# Patient Record
Sex: Female | Born: 1965 | Race: Black or African American | Hispanic: No | Smoking: Never smoker
Health system: Southern US, Community
[De-identification: ages and names within clinical notes are randomized; demographics above are authoritative.]

## PROBLEM LIST (undated history)

## (undated) ENCOUNTER — Ambulatory Visit (HOSPITAL_COMMUNITY): Discharge status: Absent without leave | Payer: 59

## (undated) DIAGNOSIS — Z87898 Personal history of other specified conditions: Secondary | ICD-10-CM

## (undated) DIAGNOSIS — K219 Gastro-esophageal reflux disease without esophagitis: Secondary | ICD-10-CM

## (undated) DIAGNOSIS — R7303 Prediabetes: Secondary | ICD-10-CM

## (undated) DIAGNOSIS — I517 Cardiomegaly: Secondary | ICD-10-CM

## (undated) DIAGNOSIS — G43909 Migraine, unspecified, not intractable, without status migrainosus: Secondary | ICD-10-CM

## (undated) DIAGNOSIS — J45909 Unspecified asthma, uncomplicated: Secondary | ICD-10-CM

## (undated) DIAGNOSIS — D649 Anemia, unspecified: Secondary | ICD-10-CM

## (undated) HISTORY — DX: Personal history of other specified conditions: Z87.898

## (undated) HISTORY — DX: Migraine, unspecified, not intractable, without status migrainosus: G43.909

## (undated) HISTORY — PX: ECTOPIC PREGNANCY SURGERY: SHX613

## (undated) HISTORY — PX: DILATION AND CURETTAGE OF UTERUS: SHX78

## (undated) HISTORY — DX: Gastro-esophageal reflux disease without esophagitis: K21.9

## (undated) HISTORY — DX: Anemia, unspecified: D64.9

## (undated) HISTORY — PX: OTHER SURGICAL HISTORY: SHX169

## (undated) HISTORY — PX: WISDOM TOOTH EXTRACTION: SHX21

---

## 2017-03-22 ENCOUNTER — Ambulatory Visit (HOSPITAL_COMMUNITY)
Admission: EM | Admit: 2017-03-22 | Discharge: 2017-03-22 | Disposition: A | Discharge status: Home / Self Care | Payer: Self-pay | Attending: Family Medicine | Admitting: Family Medicine

## 2017-03-22 ENCOUNTER — Encounter (HOSPITAL_COMMUNITY): Payer: Self-pay | Admitting: Emergency Medicine

## 2017-03-22 DIAGNOSIS — M26621 Arthralgia of right temporomandibular joint: Secondary | ICD-10-CM

## 2017-03-22 NOTE — Discharge Instructions (Signed)
Pain is most likely from inflammation in the TMJ joint. Pain may take 1-2 weeks to resolve. Continue amoxicillin course since you have already began treatment for anything that may have gone on in ear previously that we are not seeing today.  Use anti-inflammatories for pain/swelling. You may take up to 800 mg Ibuprofen every 8 hours with food. You may supplement Ibuprofen with Tylenol [phone] mg every 8 hours.   Please alternate using ice/heating pad to help with any discomfort.   Please follow up here or with your primary care if symptoms not improving in 2 weeks. Please return sooner if pain changes, worsens, develop numbness, tingling, difficulty swallowing, fever.

## 2017-03-22 NOTE — ED Provider Notes (Signed)
[facility]    CSN: [id number] Arrival date & time: 03/22/17  1232     History   Chief Complaint Chief Complaint  Patient presents with  . Otalgia    HPI Tiffany Jones is a 52 y.o. female presenting with right ear pain. Pain has been going on for 6 days. She has been self treating herself with amoxicillin 500 Q6 hours and Tylenol for pain. Points inferiorly to ear and just posterior to mandible when locating her pain. Denies increased pain with opening mouth. Occasionally worsens with meals-more so at night. Denies drainage. Feels face is slightly swollen on right side. Denies sore throat or congestion. Denies fever. Pain does worsen with talking. Denies dental pain or soreness. Denies difficulty swallowing.   HPI  History reviewed. No pertinent past medical history.  There are no active problems to display for this patient.   History reviewed. No pertinent surgical history.  OB History    No data available       Home Medications    Prior to Admission medications   Medication Sig Start Date End Date Taking? Authorizing Provider  omeprazole (PRILOSEC) 40 MG capsule Take 40 mg by mouth daily.   Yes [provider]  zonisamide (ZONEGRAN) 25 MG capsule Take 25 mg by mouth daily.   Yes [provider]    Family History History reviewed. No pertinent family history.  Social History Social History   Tobacco Use  . Smoking status: Never Smoker  . Smokeless tobacco: Never Used  Substance Use Topics  . Alcohol use: Not on file  . Drug use: Not on file     Allergies   Sulfa antibiotics   Review of Systems Review of Systems  Constitutional: Negative for fatigue and fever.  HENT: Positive for ear pain. Negative for ear discharge, sore throat and trouble swallowing.   Respiratory: Negative for cough and shortness of breath.   Cardiovascular: Negative for chest pain.  Gastrointestinal: Negative for abdominal pain, nausea and  vomiting.  Neurological: Negative for dizziness, light-headedness and headaches.     Physical Exam Triage Vital Signs ED Triage Vitals  Enc Vitals Group     BP 03/22/17 1304 (!) 120/56     Pulse Rate 03/22/17 1304 61     Resp 03/22/17 1304 16     Temp 03/22/17 1304 98.1 F (36.7 C)     Temp Source 03/22/17 1304 Oral     SpO2 03/22/17 1304 100 %     Weight --      Height --      Head Circumference --      Peak Flow --      Pain Score 03/22/17 1305 5     Pain Loc --      Pain Edu? --      Excl. in GC? --    No data found.  Updated Vital Signs BP (!) 120/56 (BP Location: Left Arm)   Pulse 61   Temp 98.1 F (36.7 C) (Oral)   Resp 16   LMP 01/19/2017   SpO2 100%    Physical Exam  Constitutional: She appears well-developed and well-nourished. No distress.  HENT:  Head: Normocephalic and atraumatic.  Right Ear: Tympanic membrane and ear canal normal.  Left Ear: Tympanic membrane and ear canal normal.  Nose: Nose normal.  Mouth/Throat: Uvula is midline, oropharynx is clear and moist and mucous membranes are normal. No oral lesions. No trismus in the jaw. No uvula swelling.  No tenderness  to palpation of masseter muscles  Eyes: Conjunctivae are normal.  Neck: Neck supple.  Cardiovascular: Normal rate and regular rhythm.  No murmur heard. Pulmonary/Chest: Effort normal and breath sounds normal. No respiratory distress.  Abdominal: Soft. There is no tenderness.  Musculoskeletal: She exhibits no edema.  Neurological: She is alert.  Skin: Skin is warm and dry.  Psychiatric: She has a normal mood and affect.  Nursing note and vitals reviewed.    UC Treatments / Results  Labs (all labs ordered are listed, but only abnormal results are displayed) Labs Reviewed - No data to display  EKG  EKG Interpretation None       Radiology No results found.  Procedures Ear Cerumen Removal Date/Time: 03/22/2017 1:34 PM Performed by: Terica Yogi, Junius Creamer, PA-C Authorized  by: Mardella Layman, MD   Consent:    Consent obtained:  Verbal   Consent given by:  Patient   Risks discussed:  Incomplete removal, TM perforation and dizziness   Alternatives discussed:  No treatment Procedure details:    Location:  R ear   Procedure type: irrigation     Procedure type comment:  Curette and irrigation performed Post-procedure details:    Inspection:  TM intact   Hearing quality:  Improved   Patient tolerance of procedure:  Tolerated well, no immediate complications Comments:     Wax initially removed with curette, visualization deeper in canal still obstructing view of TM, proceeded with irrigation     (including critical care time)  Medications Ordered in UC Medications - No data to display   Initial Impression / Assessment and Plan / UC Course  I have reviewed the triage vital signs and the nursing notes.  Pertinent labs & imaging results that were available during my care of the patient were reviewed by me and considered in my medical decision making (see chart for details).     Patient with ears clear, non-erythematous. Pain appears to be more related to TMJ inflammation; does admit to stress and clenching teeth at night. Will continue OTC NSAIDs, ice and heat. Follow up if symptoms not improving or worsening. Discussed strict return precautions. Patient verbalized understanding and is agreeable with plan.   Final Clinical Impressions(s) / UC Diagnoses   Final diagnoses:  Arthralgia of right temporomandibular joint    ED Discharge Orders    None       Controlled Substance Prescriptions [place] Controlled Substance Registry consulted? Not Applicable   Lew Dawes, [place] 03/22/17 1434

## 2017-03-22 NOTE — ED Triage Notes (Signed)
PT C/O: right ear pain onset 6 days associated w/chills  DENIES: fevers  TAKING MEDS: Taking left over Amox 500 TID x6 days and acetaminophen   A&O x4... NAD... Ambulatory

## 2017-09-19 ENCOUNTER — Encounter: Payer: Self-pay | Admitting: Family Medicine

## 2017-09-19 ENCOUNTER — Ambulatory Visit (INDEPENDENT_AMBULATORY_CARE_PROVIDER_SITE_OTHER): Payer: Self-pay | Admitting: Family Medicine

## 2017-09-19 VITALS — BP 122/80 | HR 74 | Temp 98.0°F | Ht 63.0 in | Wt 260.0 lb

## 2017-09-19 DIAGNOSIS — R8290 Unspecified abnormal findings in urine: Secondary | ICD-10-CM

## 2017-09-19 DIAGNOSIS — Z131 Encounter for screening for diabetes mellitus: Secondary | ICD-10-CM

## 2017-09-19 DIAGNOSIS — R319 Hematuria, unspecified: Secondary | ICD-10-CM

## 2017-09-19 DIAGNOSIS — Z87898 Personal history of other specified conditions: Secondary | ICD-10-CM

## 2017-09-19 DIAGNOSIS — Z09 Encounter for follow-up examination after completed treatment for conditions other than malignant neoplasm: Secondary | ICD-10-CM

## 2017-09-19 DIAGNOSIS — N390 Urinary tract infection, site not specified: Secondary | ICD-10-CM

## 2017-09-19 DIAGNOSIS — Z7689 Persons encountering health services in other specified circumstances: Secondary | ICD-10-CM

## 2017-09-19 DIAGNOSIS — Z0000 Encounter for general adult medical examination without abnormal findings: Secondary | ICD-10-CM

## 2017-09-19 DIAGNOSIS — K219 Gastro-esophageal reflux disease without esophagitis: Secondary | ICD-10-CM

## 2017-09-19 DIAGNOSIS — G43909 Migraine, unspecified, not intractable, without status migrainosus: Secondary | ICD-10-CM

## 2017-09-19 DIAGNOSIS — E559 Vitamin D deficiency, unspecified: Secondary | ICD-10-CM

## 2017-09-19 LAB — POCT URINALYSIS DIP (MANUAL ENTRY)
Bilirubin, UA: NEGATIVE
Glucose, UA: NEGATIVE mg/dL
Ketones, POC UA: NEGATIVE mg/dL
Nitrite, UA: NEGATIVE
Protein Ur, POC: NEGATIVE mg/dL
Spec Grav, UA: 1.025 (ref 1.010–1.025)
Urobilinogen, UA: 0.2 E.U./dL
pH, UA: 5.5 (ref 5.0–8.0)

## 2017-09-19 LAB — POCT GLYCOSYLATED HEMOGLOBIN (HGB A1C): Hemoglobin A1C: 5.3 % (ref 4.0–5.6)

## 2017-09-19 MED ORDER — ZONISAMIDE 25 MG PO CAPS: 25.0000 mg | ORAL_CAPSULE | Freq: Every day | ORAL | 2 refills | Status: DC

## 2017-09-19 MED ORDER — OMEPRAZOLE 40 MG PO CPDR: 40.0000 mg | DELAYED_RELEASE_CAPSULE | Freq: Every day | ORAL | 2 refills | Status: DC

## 2017-09-19 MED ORDER — SULFAMETHOXAZOLE-TRIMETHOPRIM 800-160 MG PO TABS: 1.0000 | ORAL_TABLET | Freq: Two times a day (BID) | ORAL | 0 refills | Status: DC

## 2017-09-19 MED ORDER — VITAMIN D (ERGOCALCIFEROL) 1.25 MG (50000 UNIT) PO CAPS
50000.0000 [IU] | ORAL_CAPSULE | ORAL | 2 refills | Status: DC
Start: 2017-09-19 — End: 2019-04-18

## 2017-09-19 MED ORDER — GABAPENTIN 100 MG PO CAPS: 100.0000 mg | ORAL_CAPSULE | Freq: Two times a day (BID) | ORAL | 2 refills | Status: DC

## 2017-09-19 NOTE — Progress Notes (Signed)
New Patient-Establish Care   Subjective:    Patient ID: Tiffany Jones, female    DOB: 1966/01/28, 52 y.o.   MRN: [id number]   PCP: Raliegh IpNatalie Taiwana Willison, NP  Chief Complaint  Patient presents with  . Establish Care    HPI  Ms. Searles has a past medical history of Abnormal Mammogram, Migraines, and Hypotension. She recently relocated from [place], [place]. She is here today to establish care.   Current Status: She states that she recently had an abnormal mammogram in 2018. She had a right breast biopsy at [facility] in [place], [place]. She was then she was lost to follow up in [place]. She then relocated to [place]. She has a strong family history of breast cancer in which her mother died of at the age of 51 and her older sister is presently battling with breast cancer.   She has hot flashes. She denies fevers, chills, fatigue, recent infections, and weight loss.   She reports occasional dizziness. She has not had any headaches, visual changes, and falls.   No chest pain, cough and shortness of breath reported. She has mild constipation. No reports of other GI problems such as nausea, vomiting, and diarrhea. She has no reports of blood in stools, dysuria and hematuria. She reports mild anxiety. She has generalized joint pain and occasional back pain.   History reviewed. No pertinent past medical history.  Family History  Problem Relation Age of Onset  . Breast cancer Mother   . Hypertension Father     Social History   Socioeconomic History  . Marital status: Legally Separated    Spouse name: Not on file  . Number of children: Not on file  . Years of education: Not on file  . Highest education level: Not on file  Occupational History  . Not on file  Social Needs  . Financial resource strain: Not on file  . Food insecurity:    Worry: Not on file    Inability: Not on file  . Transportation needs:    Medical: Not on file    Non-medical: Not on file  Tobacco Use  .  Smoking status: Never Smoker  . Smokeless tobacco: Never Used  Substance and Sexual Activity  . Alcohol use: Yes  . Drug use: Never  . Sexual activity: Not Currently  Lifestyle  . Physical activity:    Days per week: Not on file    Minutes per session: Not on file  . Stress: Not on file  Relationships  . Social connections:    Talks on phone: Not on file    Gets together: Not on file    Attends religious service: Not on file    Active member of club or organization: Not on file    Attends meetings of clubs or organizations: Not on file    Relationship status: Not on file  . Intimate partner violence:    Fear of current or ex partner: Not on file    Emotionally abused: Not on file    Physically abused: Not on file    Forced sexual activity: Not on file  Other Topics Concern  . Not on file  Social History Narrative  . Not on file    History reviewed. No pertinent surgical history.    There is no immunization history on file for this patient.  Current Meds  Medication Sig  . gabapentin (NEURONTIN) 100 MG capsule Take 1 capsule (100 mg total) by mouth 2 (two) times daily.  .Marland Kitchen  omeprazole (PRILOSEC) 40 MG capsule Take 1 capsule (40 mg total) by mouth daily.  . Vitamin D, Ergocalciferol, (DRISDOL) 50000 units CAPS capsule Take 1 capsule (50,000 Units total) by mouth every 7 (seven) days.  . [DISCONTINUED] gabapentin (NEURONTIN) 100 MG capsule Take 100 mg by mouth 2 (two) times daily.  . [DISCONTINUED] omeprazole (PRILOSEC) 40 MG capsule Take 40 mg by mouth daily.  . [DISCONTINUED] Vitamin D, Ergocalciferol, (DRISDOL) 50000 units CAPS capsule Take 50,000 Units by mouth every 7 (seven) days.   Allergies  Allergen Reactions  . Sulfa Antibiotics Hives    BP 122/80 (BP Location: Left Arm, Patient Position: Sitting, Cuff Size: Large)   Pulse 74   Temp 98 F (36.7 C) (Oral)   Ht 5\' 3"  (1.6 m)   Wt 260 lb (117.9 kg)   LMP 07/18/2017   SpO2 98%   BMI 46.06 kg/m   Review of  Systems  Constitutional: Negative.   HENT: Negative.   Eyes: Negative.   Respiratory: Negative.   Cardiovascular: Negative.   Gastrointestinal: Positive for abdominal distention (Obese) and constipation.  Endocrine: Negative.   Genitourinary: Negative.   Musculoskeletal: Negative.   Skin: Negative.   Allergic/Immunologic: Negative.   Neurological: Positive for dizziness.  Hematological: Negative.   Psychiatric/Behavioral: Negative.    Objective:   Physical Exam  Constitutional: She is oriented to person, place, and time. She appears well-developed and well-nourished.  HENT:  Head: Normocephalic and atraumatic.  Right Ear: External ear normal.  Left Ear: External ear normal.  Nose: Nose normal.  Mouth/Throat: Oropharynx is clear and moist.  Eyes: Pupils are equal, round, and reactive to light. Conjunctivae and EOM are normal.  Neck: Normal range of motion. Neck supple.  Cardiovascular: Normal rate, regular rhythm and intact distal pulses.  Pulmonary/Chest: Breath sounds normal.  Abdominal: Soft. Bowel sounds are normal.  Musculoskeletal: Normal range of motion.  Neurological: She is alert and oriented to person, place, and time.  Skin: Skin is warm and dry. Capillary refill takes less than 2 seconds.  Psychiatric: She has a normal mood and affect. Her behavior is normal. Judgment and thought content normal.   Assessment & Plan:   1. Encounter to establish care  2. Screening for diabetes mellitus Hgb A1c is normal at 5.3.  - POCT glycosylated hemoglobin (Hb A1C) - POCT urinalysis dipstick  3. Abnormal urinalysis - Urine Culture  4. Urinary tract infection with hematuria, site unspecified - Urine Culture - sulfamethoxazole-trimethoprim (BACTRIM DS,SEPTRA DS) 800-160 MG tablet; Take 1 tablet by mouth 2 (two) times daily.  Dispense: 14 tablet; Refill: 0  5. Migraine without status migrainosus, not intractable, unspecified migraine type Patient states that she was  previously prescribed Neurontin and Zonisamide for headaches.  - gabapentin (NEURONTIN) 100 MG capsule; Take 1 capsule (100 mg total) by mouth 2 (two) times daily.  Dispense: 60 capsule; Refill: 2 - zonisamide (ZONEGRAN) 25 MG capsule; Take 1 capsule (25 mg total) by mouth daily.  Dispense: 30 capsule; Refill: 2  6. Health care maintenance - CBC with Differential - Comprehensive metabolic panel - Lipid Panel - TSH  7. Gastroesophageal reflux disease without esophagitis We will refill Prilosec today.  - omeprazole (PRILOSEC) 40 MG capsule; Take 1 capsule (40 mg total) by mouth daily.  Dispense: 30 capsule; Refill: 2  8. Vitamin D deficiency - Vitamin D, Ergocalciferol, (DRISDOL) 50000 units CAPS capsule; Take 1 capsule (50,000 Units total) by mouth every 7 (seven) days.  Dispense: 5 capsule; Refill: 2 - Vitamin  D, 25-hydroxy  9. History of abnormal mammogram She recently had abnormal Mammogram, biopsy was performed on right breast, she was then lost to follow up. Strong family history.  We will refer her to Oncology. We will assess the need for repeat Mammogram. We will request records from previous radiologist in [place].   10. Follow up She will follow up in 1 month.   Meds ordered this encounter  Medications  . sulfamethoxazole-trimethoprim (BACTRIM DS,SEPTRA DS) 800-160 MG tablet    Sig: Take 1 tablet by mouth 2 (two) times daily.    Dispense:  14 tablet    Refill:  0  . gabapentin (NEURONTIN) 100 MG capsule    Sig: Take 1 capsule (100 mg total) by mouth 2 (two) times daily.    Dispense:  60 capsule    Refill:  2  . omeprazole (PRILOSEC) 40 MG capsule    Sig: Take 1 capsule (40 mg total) by mouth daily.    Dispense:  30 capsule    Refill:  2  . Vitamin D, Ergocalciferol, (DRISDOL) 50000 units CAPS capsule    Sig: Take 1 capsule (50,000 Units total) by mouth every 7 (seven) days.    Dispense:  5 capsule    Refill:  2  . zonisamide (ZONEGRAN) 25 MG capsule    Sig: Take 1  capsule (25 mg total) by mouth daily.    Dispense:  30 capsule    Refill:  2    Raliegh Ip,  MSN, FNP-C [facility] [address] [place], [place] [address] [phone]

## 2017-09-19 NOTE — Patient Instructions (Signed)
Sulfamethoxazole; Trimethoprim, SMX-TMP tablets What is this medicine? SULFAMETHOXAZOLE; TRIMETHOPRIM or SMX-TMP (suhl fuh meth OK suh zohl; trye METH oh prim) is a combination of a sulfonamide antibiotic and a second antibiotic, trimethoprim. It is used to treat or prevent certain kinds of bacterial infections. It will not work for colds, flu, or other viral infections. This medicine may be used for other purposes; ask your health care provider or pharmacist if you have questions. COMMON BRAND NAME(S): Bacter-Aid DS, Bactrim, Bactrim DS, Septra, Septra DS What should I tell my health care provider before I take this medicine? They need to know if you have any of these conditions: -anemia -asthma -being treated with anticonvulsants -if you frequently drink alcohol containing drinks -kidney disease -liver disease -low level of folic acid or glucose-6-phosphate dehydrogenase -poor nutrition or malabsorption -porphyria -severe allergies -thyroid disorder -an unusual or allergic reaction to sulfamethoxazole, trimethoprim, sulfa drugs, other medicines, foods, dyes, or preservatives -pregnant or trying to get pregnant -breast-feeding How should I use this medicine? Take this medicine by mouth with a full glass of water. Follow the directions on the prescription label. Take your medicine at regular intervals. Do not take it more often than directed. Do not skip doses or stop your medicine early. Talk to your pediatrician regarding the use of this medicine in children. Special care may be needed. This medicine has been used in children as young as 2 months of age. Overdosage: If you think you have taken too much of this medicine contact a poison control center or emergency room at once. NOTE: This medicine is only for you. Do not share this medicine with others. What if I miss a dose? If you miss a dose, take it as soon as you can. If it is almost time for your next dose, take only that dose. Do  not take double or extra doses. What may interact with this medicine? Do not take this medicine with any of the following medications: -aminobenzoate potassium -dofetilide -metronidazole This medicine may also interact with the following medications: -ACE inhibitors like benazepril, enalapril, lisinopril, and ramipril -birth control pills -cyclosporine -digoxin -diuretics -indomethacin -medicines for diabetes -methenamine -methotrexate -phenytoin -potassium supplements -pyrimethamine -sulfinpyrazone -tricyclic antidepressants -warfarin This list may not describe all possible interactions. Give your health care provider a list of all the medicines, herbs, non-prescription drugs, or dietary supplements you use. Also tell them if you smoke, drink alcohol, or use illegal drugs. Some items may interact with your medicine. What should I watch for while using this medicine? Tell your doctor or health care professional if your symptoms do not improve. Drink several glasses of water a day to reduce the risk of kidney problems. Do not treat diarrhea with over the counter products. Contact your doctor if you have diarrhea that lasts more than 2 days or if it is severe and watery. This medicine can make you more sensitive to the sun. Keep out of the sun. If you cannot avoid being in the sun, wear protective clothing and use a sunscreen. Do not use sun lamps or tanning beds/booths. What side effects may I notice from receiving this medicine? Side effects that you should report to your doctor or health care professional as soon as possible: -allergic reactions like skin rash or hives, swelling of the face, lips, or tongue -breathing problems -fever or chills, sore throat -irregular heartbeat, chest pain -joint or muscle pain -pain or difficulty passing urine -red pinpoint spots on skin -redness, blistering, peeling or loosening of   the skin, including inside the mouth -unusual bleeding or  bruising -unusually weak or tired -yellowing of the eyes or skin Side effects that usually do not require medical attention (report to your doctor or health care professional if they continue or are bothersome): -diarrhea -dizziness -headache -loss of appetite -nausea, vomiting -nervousness This list may not describe all possible side effects. Call your doctor for medical advice about side effects. You may report side effects to FDA at 1-800-FDA-1088. Where should I keep my medicine? Keep out of the reach of children. Store at room temperature between 20 to 25 degrees C (68 to 77 degrees F). Protect from light. Throw away any unused medicine after the expiration date. NOTE: This sheet is a summary. It may not cover all possible information. If you have questions about this medicine, talk to your doctor, pharmacist, or health care provider.  2018 Elsevier/Gold Standard (2012-09-28 14:38:26)  

## 2017-09-20 LAB — LIPID PANEL
Chol/HDL Ratio: 4.3 ratio (ref 0.0–4.4)
Cholesterol, Total: 230 mg/dL — ABNORMAL HIGH (ref 100–199)
HDL: 54 mg/dL (ref >39)
LDL Calculated: 160 mg/dL — ABNORMAL HIGH (ref 0–99)
Triglycerides: 81 mg/dL (ref 0–149)
VLDL Cholesterol Cal: 16 mg/dL (ref 5–40)

## 2017-09-20 LAB — COMPREHENSIVE METABOLIC PANEL
ALT: 10 IU/L (ref 0–32)
AST: 12 IU/L (ref 0–40)
Albumin/Globulin Ratio: 1.4 (ref 1.2–2.2)
Albumin: 4.2 g/dL (ref 3.5–5.5)
Alkaline Phosphatase: 79 IU/L (ref 39–117)
BUN/Creatinine Ratio: 14 (ref 9–23)
BUN: 11 mg/dL (ref 6–24)
Bilirubin Total: 0.4 mg/dL (ref 0.0–1.2)
CO2: 22 mmol/L (ref 20–29)
Calcium: 9.4 mg/dL (ref 8.7–10.2)
Chloride: 104 mmol/L (ref 96–106)
Creatinine, Ser: 0.76 mg/dL (ref 0.57–1.00)
GFR calc Af Amer: 106 mL/min/{1.73_m2} (ref >59)
GFR calc non Af Amer: 92 mL/min/{1.73_m2} (ref >59)
Globulin, Total: 3.1 g/dL (ref 1.5–4.5)
Glucose: 102 mg/dL — ABNORMAL HIGH (ref 65–99)
Potassium: 4.4 mmol/L (ref 3.5–5.2)
Sodium: 141 mmol/L (ref 134–144)
Total Protein: 7.3 g/dL (ref 6.0–8.5)

## 2017-09-20 LAB — CBC WITH DIFFERENTIAL/PLATELET
Basophils Absolute: 0 10*3/uL (ref 0.0–0.2)
Basos: 1 %
EOS (ABSOLUTE): 0.2 10*3/uL (ref 0.0–0.4)
Eos: 3 %
Hematocrit: 36.2 % (ref 34.0–46.6)
Hemoglobin: 11.6 g/dL (ref 11.1–15.9)
Immature Grans (Abs): 0 10*3/uL (ref 0.0–0.1)
Immature Granulocytes: 0 %
Lymphocytes Absolute: 1.3 10*3/uL (ref 0.7–3.1)
Lymphs: 31 %
MCH: 29 pg (ref 26.6–33.0)
MCHC: 32 g/dL (ref 31.5–35.7)
MCV: 91 fL (ref 79–97)
Monocytes Absolute: 0.3 10*3/uL (ref 0.1–0.9)
Monocytes: 6 %
Neutrophils Absolute: 2.6 10*3/uL (ref 1.4–7.0)
Neutrophils: 59 %
Platelets: 292 10*3/uL (ref 150–450)
RBC: 4 x10E6/uL (ref 3.77–5.28)
RDW: 13.6 % (ref 12.3–15.4)
WBC: 4.4 10*3/uL (ref 3.4–10.8)

## 2017-09-20 LAB — TSH: TSH: 1.71 u[IU]/mL (ref 0.450–4.500)

## 2017-09-21 LAB — SPECIMEN STATUS REPORT

## 2017-09-21 LAB — URINE CULTURE: Organism ID, Bacteria: NO GROWTH

## 2017-09-21 LAB — VITAMIN D 25 HYDROXY (VIT D DEFICIENCY, FRACTURES): Vit D, 25-Hydroxy: 16.9 ng/mL — ABNORMAL LOW (ref 30.0–100.0)

## 2017-09-22 ENCOUNTER — Other Ambulatory Visit: Payer: Self-pay | Admitting: Family Medicine

## 2017-09-22 DIAGNOSIS — Z87898 Personal history of other specified conditions: Secondary | ICD-10-CM

## 2017-09-22 NOTE — Progress Notes (Signed)
Referral to Oncology for Abnormal Mammogram today.

## 2017-09-22 NOTE — Addendum Note (Signed)
Addended by: Kallie LocksSTROUD, Milliani Herrada M on: 09/22/2017 07:44 PM   Modules accepted: Level of Service

## 2017-09-25 ENCOUNTER — Other Ambulatory Visit: Payer: Self-pay | Admitting: Family Medicine

## 2017-10-02 ENCOUNTER — Other Ambulatory Visit: Payer: Self-pay | Admitting: Family Medicine

## 2017-10-02 DIAGNOSIS — R928 Other abnormal and inconclusive findings on diagnostic imaging of breast: Secondary | ICD-10-CM

## 2017-10-02 NOTE — Progress Notes (Signed)
Referral sent to [facility] today.

## 2017-10-06 ENCOUNTER — Telehealth: Payer: Self-pay

## 2017-10-06 NOTE — Telephone Encounter (Signed)
Left a vm for patient callback

## 2017-10-06 NOTE — Telephone Encounter (Signed)
-----   Message from Kallie LocksNatalie M Stroud, FNP sent at 10/05/2017  4:10 PM EDT ----- Regarding: "Lab Results" Tiffany Jones,  Inform patient that labs are stable. Cholesterol is mildly elevated. She will follow a low-fat diet.   I have sent referral to [facility] for evaluation of history of abnormal Mammogram. She should expect a call from them soon.   Thank you.

## 2017-10-06 NOTE — Telephone Encounter (Signed)
Patient notified

## 2017-10-06 NOTE — Telephone Encounter (Signed)
-----   Message from Natalie M Stroud, FNP sent at 10/05/2017  4:10 PM EDT ----- Regarding: "Lab Results" Carrie,  Inform patient that labs are stable. Cholesterol is mildly elevated. She will follow a low-fat diet.   I have sent referral to [facility] for evaluation of history of abnormal Mammogram. She should expect a call from them soon.   Thank you. 

## 2017-10-20 ENCOUNTER — Ambulatory Visit: Payer: Self-pay | Admitting: Family Medicine

## 2018-01-01 ENCOUNTER — Encounter (HOSPITAL_COMMUNITY): Payer: Self-pay

## 2018-01-01 ENCOUNTER — Ambulatory Visit (HOSPITAL_COMMUNITY)
Admission: EM | Admit: 2018-01-01 | Discharge: 2018-01-01 | Disposition: A | Discharge status: Home / Self Care | Payer: Self-pay | Attending: Family Medicine | Admitting: Family Medicine

## 2018-01-01 ENCOUNTER — Other Ambulatory Visit: Payer: Self-pay

## 2018-01-01 DIAGNOSIS — R05 Cough: Secondary | ICD-10-CM

## 2018-01-01 DIAGNOSIS — J22 Unspecified acute lower respiratory infection: Secondary | ICD-10-CM

## 2018-01-01 MED ORDER — IPRATROPIUM-ALBUTEROL 0.5-2.5 (3) MG/3ML IN SOLN
3.0000 mL | Freq: Once | RESPIRATORY_TRACT | Status: AC
  Administered 2018-01-01: 3 mL via RESPIRATORY_TRACT

## 2018-01-01 MED ORDER — PREDNISONE 20 MG PO TABS: 40.0000 mg | ORAL_TABLET | Freq: Every day | ORAL | 0 refills | Status: AC

## 2018-01-01 MED ORDER — AZITHROMYCIN 250 MG PO TABS: ORAL_TABLET | ORAL | 0 refills | Status: AC

## 2018-01-01 MED ORDER — BENZONATATE 100 MG PO CAPS: 100.0000 mg | ORAL_CAPSULE | Freq: Three times a day (TID) | ORAL | 0 refills | Status: DC | PRN

## 2018-01-01 MED ORDER — IPRATROPIUM-ALBUTEROL 0.5-2.5 (3) MG/3ML IN SOLN
RESPIRATORY_TRACT | Status: AC
  Filled 2018-01-01: qty 3

## 2018-01-01 NOTE — ED Triage Notes (Signed)
Pt c/o cough x 2 weeks  

## 2018-01-01 NOTE — Discharge Instructions (Signed)
Push fluids to ensure adequate hydration and keep secretions thin.  Tylenol and/or ibuprofen as needed for pain or fevers.  Complete course of antibiotics.  5 days of prednisone.  Tessalon as needed for cough. Use of inhalers as needed for wheezing, tightness or shortness of breath.  If symptoms worsen or do not improve in the next week to return to be seen or to follow up with your PCP.

## 2018-01-01 NOTE — ED Provider Notes (Signed)
[facility]    CSN: [id number] Arrival date & time: 01/01/18  1542     History   Chief Complaint Chief Complaint  Patient presents with  . Cough    HPI Tiffany Jones is a 52 y.o. female.   Tiffany Jones presents with complaints of persistent cough. Started approximately 10 days ago. Feels chest tightness, has been using her inhalers. History of asthma. Has had pneumonia in the past. Occasional production with cough but often dry cough. Fevers with initial onset of illness which have resolved. Nasal congestion but states also has seasonal allergies. Some shortness of breath. Doesn't smoke. No ear pain. Throat irritated from cough. No gi/gu complaints. Sons also with URI symptoms. Has been also using robitussin and theraflu.    ROS per HPI.      History reviewed. No pertinent past medical history.  There are no active problems to display for this patient.   History reviewed. No pertinent surgical history.  OB History   None      Home Medications    Prior to Admission medications   Medication Sig Start Date End Date Taking? Authorizing Provider  azithromycin (ZITHROMAX) 250 MG tablet Take 2 tablets (500 mg total) by mouth daily for 1 day, THEN 1 tablet (250 mg total) daily for 4 days. 01/01/18 01/06/18  Georgetta Haber, NP  benzonatate (TESSALON) 100 MG capsule Take 1-2 capsules (100-200 mg total) by mouth 3 (three) times daily as needed for cough. 01/01/18   Georgetta Haber, NP  gabapentin (NEURONTIN) 100 MG capsule Take 1 capsule (100 mg total) by mouth 2 (two) times daily. 09/19/17   Kallie Locks, FNP  omeprazole (PRILOSEC) 40 MG capsule Take 1 capsule (40 mg total) by mouth daily. 09/19/17   Kallie Locks, FNP  predniSONE (DELTASONE) 20 MG tablet Take 2 tablets (40 mg total) by mouth daily with breakfast for 5 days. 01/01/18 01/06/18  Georgetta Haber, NP  sulfamethoxazole-trimethoprim (BACTRIM DS,SEPTRA DS) 800-160 MG tablet Take 1 tablet by  mouth 2 (two) times daily. 09/19/17   Kallie Locks, FNP  Vitamin D, Ergocalciferol, (DRISDOL) 50000 units CAPS capsule Take 1 capsule (50,000 Units total) by mouth every 7 (seven) days. 09/19/17   Kallie Locks, FNP  zonisamide (ZONEGRAN) 25 MG capsule Take 1 capsule (25 mg total) by mouth daily. 09/19/17   Kallie Locks, FNP    Family History Family History  Problem Relation Age of Onset  . Breast cancer Mother   . Hypertension Father     Social History Social History   Tobacco Use  . Smoking status: Never Smoker  . Smokeless tobacco: Never Used  Substance Use Topics  . Alcohol use: Yes  . Drug use: Never     Allergies   Sulfa antibiotics   Review of Systems Review of Systems   Physical Exam Triage Vital Signs ED Triage Vitals  Enc Vitals Group     BP 01/01/18 1554 127/62     Pulse --      Resp 01/01/18 1554 18     Temp 01/01/18 1554 98.3 F (36.8 C)     Temp Source 01/01/18 1554 Tympanic     SpO2 01/01/18 1554 100 %     Weight 01/01/18 1552 265 lb (120.2 kg)     Height --      Head Circumference --      Peak Flow --      Pain Score 01/01/18 1552 5  Pain Loc --      Pain Edu? --      Excl. in GC? --    No data found.  Updated Vital Signs BP 127/62 (BP Location: Right Arm)   Temp 98.3 F (36.8 C) (Tympanic)   Resp 18   Wt 265 lb (120.2 kg)   SpO2 100%   BMI 46.94 kg/m   Visual Acuity Right Eye Distance:   Left Eye Distance:   Bilateral Distance:    Right Eye Near:   Left Eye Near:    Bilateral Near:     Physical Exam  Constitutional: She is oriented to person, place, and time. She appears well-developed and well-nourished. No distress.  HENT:  Head: Normocephalic and atraumatic.  Right Ear: Tympanic membrane, external ear and ear canal normal.  Left Ear: Tympanic membrane, external ear and ear canal normal.  Nose: Nose normal.  Mouth/Throat: Uvula is midline, oropharynx is clear and moist and mucous membranes are normal. No  tonsillar exudate.  Eyes: Pupils are equal, round, and reactive to light. Conjunctivae and EOM are normal.  Cardiovascular: Normal rate, regular rhythm and normal heart sounds.  Pulmonary/Chest: Effort normal. She has decreased breath sounds.  Frequent strong dry cough noted, illicited with deep breathing.   Neurological: She is alert and oriented to person, place, and time.  Skin: Skin is warm and dry.     UC Treatments / Results  Labs (all labs ordered are listed, but only abnormal results are displayed) Labs Reviewed - No data to display  EKG None  Radiology No results found.  Procedures Procedures (including critical care time)  Medications Ordered in UC Medications  ipratropium-albuterol (DUONEB) 0.5-2.5 (3) MG/3ML nebulizer solution 3 mL (has no administration in time range)    Initial Impression / Assessment and Plan / UC Course  I have reviewed the triage vital signs and the nursing notes.  Pertinent labs & imaging results that were available during my care of the patient were reviewed by me and considered in my medical decision making (see chart for details).     Afebrile here, non toxic appearing. No tachycardia, tachypnea or hypoxia. Frequent cough, hx of asthma, >1 week of symptoms. Will cover for atypicals with azithromycin, course of prednisone provided as well. Tessalon PRN. Return precautions provided. If symptoms worsen or do not improve in the next week to return to be seen or to follow up with PCP.  Patient verbalized understanding and agreeable to plan.    Final Clinical Impressions(s) / UC Diagnoses   Final diagnoses:  Lower respiratory infection     Discharge Instructions     Push fluids to ensure adequate hydration and keep secretions thin.  Tylenol and/or ibuprofen as needed for pain or fevers.  Complete course of antibiotics.  5 days of prednisone.  Tessalon as needed for cough. Use of inhalers as needed for wheezing, tightness or shortness  of breath.  If symptoms worsen or do not improve in the next week to return to be seen or to follow up with your PCP.      ED Prescriptions    Medication Sig Dispense Auth. Provider   azithromycin (ZITHROMAX) 250 MG tablet Take 2 tablets (500 mg total) by mouth daily for 1 day, THEN 1 tablet (250 mg total) daily for 4 days. 6 tablet Linus Mako B, NP   predniSONE (DELTASONE) 20 MG tablet Take 2 tablets (40 mg total) by mouth daily with breakfast for 5 days. 10 tablet Georgetta Haber,  NP   benzonatate (TESSALON) 100 MG capsule Take 1-2 capsules (100-200 mg total) by mouth 3 (three) times daily as needed for cough. 21 capsule Georgetta Haber, NP     Controlled Substance Prescriptions [place] Controlled Substance Registry consulted? Not Applicable   Georgetta Haber, NP 01/01/18 1640

## 2018-05-10 ENCOUNTER — Ambulatory Visit: Payer: Self-pay | Attending: Family Medicine | Admitting: Family Medicine

## 2018-05-10 ENCOUNTER — Encounter: Payer: Self-pay | Admitting: Family Medicine

## 2018-05-10 VITALS — BP 78/57 | HR 65 | Temp 98.1°F | Ht 63.0 in | Wt 273.0 lb

## 2018-05-10 DIAGNOSIS — R21 Rash and other nonspecific skin eruption: Secondary | ICD-10-CM

## 2018-05-10 DIAGNOSIS — Z1239 Encounter for other screening for malignant neoplasm of breast: Secondary | ICD-10-CM

## 2018-05-10 DIAGNOSIS — G43909 Migraine, unspecified, not intractable, without status migrainosus: Secondary | ICD-10-CM

## 2018-05-10 DIAGNOSIS — Z6841 Body Mass Index (BMI) 40.0 and over, adult: Secondary | ICD-10-CM

## 2018-05-10 DIAGNOSIS — G479 Sleep disorder, unspecified: Secondary | ICD-10-CM

## 2018-05-10 DIAGNOSIS — K219 Gastro-esophageal reflux disease without esophagitis: Secondary | ICD-10-CM

## 2018-05-10 MED ORDER — CLOTRIMAZOLE-BETAMETHASONE 1-0.05 % EX CREA: 1.0000 "application " | TOPICAL_CREAM | Freq: Two times a day (BID) | CUTANEOUS | 4 refills | Status: DC

## 2018-05-10 MED ORDER — ZONISAMIDE 25 MG PO CAPS: 25.0000 mg | ORAL_CAPSULE | Freq: Every day | ORAL | 2 refills | Status: DC

## 2018-05-10 MED ORDER — GABAPENTIN 100 MG PO CAPS: 100.0000 mg | ORAL_CAPSULE | Freq: Two times a day (BID) | ORAL | 2 refills | Status: DC

## 2018-05-10 MED ORDER — OMEPRAZOLE 40 MG PO CPDR: 40.0000 mg | DELAYED_RELEASE_CAPSULE | Freq: Every day | ORAL | 2 refills | Status: DC

## 2018-05-10 NOTE — Progress Notes (Signed)
Patient has rash on left arm, also thinks that she may have sleep apnea.

## 2018-05-10 NOTE — Progress Notes (Signed)
Established Patient Office Visit  Subjective:  Patient ID: Tiffany Jones, female    DOB: 1965/04/17  Age: 53 y.o. MRN: [id number]  CC:  Chief Complaint  Patient presents with  . Establish Care    HPI Tiffany Jones presents to establish care.  Patient reports that she moved to the area recently from [place].  Patient reports medical history significant for recurrent migraine type headaches for which she takes Zonegran and Neurontin.  Patient states that these 2 medications have controlled her migraines.  Patient does have some light and noise sensitivity with headaches and headaches are pressure sensation that can occur on either the left or right side of her head and sometimes on both sides at the same time.  Patient with occasional nausea with her headaches.  Resting in a dark quiet area will help headaches resolve faster.  Patient also reports a strong family history of breast cancer.  Patient states that when she lived in [place] she was told at one point that she had an abnormality on her mammogram that needed follow-up and patient had a right breast biopsy which was benign.  Patient however has not had an additional mammogram done since that time.  Patient has found no abnormalities on self breast exam and denies any issues with breast tenderness/breast pain.      Patient reports that she does have a history of acid reflux for which she has been on omeprazole and patient would like a prescription for this medication.  Patient denies any burping/belching or backwash of bad tasting fluid into her throat/mouth and she has had no epigastric or other abdominal pain as long as she takes acid reflux medication on a daily basis and also avoids foods which she knows tend to trigger her reflux symptoms.  Patient has also noticed a rash on her right arm at the crook of her elbow.  She feels that the rashes itchy.  Patient has had a similar rash in the past which resolved.  Patient is not sure  if this might be fungal or may represent a form of eczema.  Patient has had no past issues with elevated blood sugars or diabetes.      Patient does have issues with feeling fatigued as well as snoring.  Patient believes that she may have sleep apnea.  Patient denies any issues with feeling as if she will fall asleep during driving and does not feel as if she will fall asleep if she is watching television/sitting quietly.  Patient does feel as if her sleep is nonrestorative as she is still often tired even after sleeping for 8 hours.   Past Medical History:  Diagnosis Date  . GERD (gastroesophageal reflux disease)   . History of abnormal mammogram   . Migraines    Past Surgical History:  Procedure Laterality Date  . Right breast biopsy          Family History  Problem Relation Age of Onset  . Breast cancer Mother   . Hypertension Father    Social History   Tobacco Use  . Smoking status: Never Smoker  . Smokeless tobacco: Never Used  Substance Use Topics  . Alcohol use: Yes    Comment: socially  . Drug use: Never   Allergies  Allergen Reactions  . Sulfa Antibiotics Hives      Outpatient Medications Prior to Visit  Medication Sig Dispense Refill  . benzonatate (TESSALON) 100 MG capsule Take 1-2 capsules (100-200 mg total) by mouth  3 (three) times daily as needed for cough. 21 capsule 0  . gabapentin (NEURONTIN) 100 MG capsule Take 1 capsule (100 mg total) by mouth 2 (two) times daily. 60 capsule 2  . omeprazole (PRILOSEC) 40 MG capsule Take 1 capsule (40 mg total) by mouth daily. 30 capsule 2  . Vitamin D, Ergocalciferol, (DRISDOL) 50000 units CAPS capsule Take 1 capsule (50,000 Units total) by mouth every 7 (seven) days. 5 capsule 2  . zonisamide (ZONEGRAN) 25 MG capsule Take 1 capsule (25 mg total) by mouth daily. 30 capsule 2  . sulfamethoxazole-trimethoprim (BACTRIM DS,SEPTRA DS) 800-160 MG tablet Take 1 tablet by mouth 2 (two) times daily. (Patient not taking: Reported  on 05/10/2018) 14 tablet 0   No facility-administered medications prior to visit.     Allergies  Allergen Reactions  . Sulfa Antibiotics Hives    ROS Review of Systems  Constitutional: Positive for fatigue. Negative for chills and fever.  HENT: Negative for hearing loss, sore throat and trouble swallowing.   Respiratory: Negative for cough and shortness of breath.   Cardiovascular: Negative for chest pain, palpitations and leg swelling.  Gastrointestinal: Negative for abdominal pain, blood in stool, constipation, diarrhea and nausea.  Endocrine: Negative for cold intolerance, heat intolerance, polydipsia, polyphagia and polyuria.  Genitourinary: Negative for dysuria and frequency.  Musculoskeletal: Negative for arthralgias, back pain and gait problem.  Skin: Positive for rash. Negative for wound.  Neurological: Positive for headaches. Negative for dizziness and numbness.  Hematological: Negative for adenopathy. Does not bruise/bleed easily.  Psychiatric/Behavioral: Positive for sleep disturbance. Negative for self-injury and suicidal ideas. The patient is nervous/anxious.       Objective:    Physical Exam  Constitutional: She is oriented to person, place, and time. She appears well-developed and well-nourished.  Morbidly obese  HENT:  Head: Normocephalic and atraumatic.  Right Ear: Hearing, tympanic membrane, external ear and ear canal normal.  Left Ear: Hearing, tympanic membrane, external ear and ear canal normal.  Nose: Mucosal edema present. No rhinorrhea.  Patient with narrowed posterior airway secondary to body habitus and large tongue base  Eyes: Pupils are equal, round, and reactive to light. Conjunctivae and EOM are normal.  Neck: Normal range of motion. Neck supple.  Large neck size  Cardiovascular: Normal rate and regular rhythm.  Pulmonary/Chest: Effort normal and breath sounds normal.  Abdominal: Soft. There is no abdominal tenderness. There is no rebound and no  guarding.  Truncal obesity  Musculoskeletal: Normal range of motion.        General: No tenderness or edema.  Lymphadenopathy:    She has no cervical adenopathy.  Neurological: She is alert and oriented to person, place, and time. No cranial nerve deficit.  Skin: Skin is warm and dry. Rash (Patient with a somewhat dry, hyperpigmented, macular rash on the right medial antecubital fossa) noted.  Psychiatric: She has a normal mood and affect. Her behavior is normal. Judgment normal.  Nursing note and vitals reviewed.   BP (!) 78/57 (BP Location: Left Arm, Patient Position: Sitting, Cuff Size: Large)   Pulse 65   Temp 98.1 F (36.7 C) (Oral)   Ht  (1.6 m)   Wt 273 lb (123.8 kg)   BMI 48.36 kg/m  Wt Readings from Last 3 Encounters:  05/10/18 273 lb (123.8 kg)  01/01/18 265 lb (120.2 kg)  09/19/17 260 lb (117.9 kg)     Health Maintenance Due  Topic Date Due  . HIV Screening  09/22/1981  .  TETANUS/TDAP  09/22/1985  . PAP SMEAR-Modifier  09/23/1987  . MAMMOGRAM  09/22/2016  . COLONOSCOPY  09/22/2016  . INFLUENZA VACCINE  10/05/2017    There are no preventive care reminders to display for this patient.  Lab Results  Component Value Date   TSH 1.710 09/19/2017   Lab Results  Component Value Date   WBC 4.4 09/19/2017   HGB 11.6 09/19/2017   HCT 36.2 09/19/2017   MCV 91 09/19/2017   PLT 292 09/19/2017   Lab Results  Component Value Date   NA 141 09/19/2017   K 4.4 09/19/2017   CO2 22 09/19/2017   GLUCOSE 102 (H) 09/19/2017   BUN 11 09/19/2017   CREATININE 0.76 09/19/2017   BILITOT 0.4 09/19/2017   ALKPHOS 79 09/19/2017   AST 12 09/19/2017   ALT 10 09/19/2017   PROT 7.3 09/19/2017   ALBUMIN 4.2 09/19/2017   CALCIUM 9.4 09/19/2017   Lab Results  Component Value Date   CHOL 230 (H) 09/19/2017   Lab Results  Component Value Date   HDL 54 09/19/2017   Lab Results  Component Value Date   LDLCALC 160 (H) 09/19/2017   Lab Results  Component Value Date     TRIG 81 09/19/2017   Lab Results  Component Value Date   CHOLHDL 4.3 09/19/2017   Lab Results  Component Value Date   HGBA1C 5.3 09/19/2017      Assessment & Plan:  1. Migraine without status migrainosus, not intractable, unspecified migraine type Patient reports that she has migraines which are currently controlled with the use of Zonegran and Neurontin and these medications are refilled.  Patient was also asked to sign release for her past medical records - zonisamide (ZONEGRAN) 25 MG capsule; Take 1 capsule (25 mg total) by mouth daily.  Dispense: 30 capsule; Refill: 2 - gabapentin (NEURONTIN) 100 MG capsule; Take 1 capsule (100 mg total) by mouth 2 (two) times daily.  Dispense: 60 capsule; Refill: 2  2. Gastroesophageal reflux disease without esophagitis Patient should continue to avoid known trigger foods as well as avoidance of late night eating.  New prescription provided for omeprazole - omeprazole (PRILOSEC) 40 MG capsule; Take 1 capsule (40 mg total) by mouth daily.  Dispense: 30 capsule; Refill: 2  3. Sleep disturbance Patient with complaint of a sleep disturbance including nonrestorative sleep, daytime fatigue and snoring.  Patient also with a large neck size and a narrowed posterior airway with large tongue base on exam and patient will be referred for split-night sleep study for evaluation of sleep apnea - Split night study; Future  4. Rash Patient with a rash which could be a type of atopic eczema versus fungal dermatitis.  Prescription provided for Lotrisone to apply twice daily x7 days and then as needed.  Patient should call or return if her symptoms have not improved - clotrimazole-betamethasone (LOTRISONE) cream; Apply 1 application topically 2 (two) times daily. X 7 days then as needed  Dispense: 30 g; Refill: 4  5. Screening for breast cancer Patient with strong family history of breast cancer and patient will be referred for screening mammogram - MM Digital  Screening; Future  An After Visit Summary was printed and given to the patient. Allergies as of 05/10/2018      Reactions   Sulfa Antibiotics Hives      Medication List       Accurate as of May 10, 2018 11:59 PM. Always use your most recent med list.  benzonatate 100 MG capsule Commonly known as:  TESSALON Take 1-2 capsules (100-200 mg total) by mouth 3 (three) times daily as needed for cough.   clotrimazole-betamethasone cream Commonly known as:  Lotrisone Apply 1 application topically 2 (two) times daily. X 7 days then as needed   gabapentin 100 MG capsule Commonly known as:  NEURONTIN Take 1 capsule (100 mg total) by mouth 2 (two) times daily.   omeprazole 40 MG capsule Commonly known as:  PRILOSEC Take 1 capsule (40 mg total) by mouth daily.   sulfamethoxazole-trimethoprim 800-160 MG tablet Commonly known as:  BACTRIM DS,SEPTRA DS Take 1 tablet by mouth 2 (two) times daily.   Vitamin D (Ergocalciferol) 1.25 MG (50000 UT) Caps capsule Commonly known as:  DRISDOL Take 1 capsule (50,000 Units total) by mouth every 7 (seven) days.   zonisamide 25 MG capsule Commonly known as:  ZONEGRAN Take 1 capsule (25 mg total) by mouth daily.        Follow-up: Return in about 2 months (around 07/10/2018) for migraines/sleep study follow-up.    Cain Saupeammie Carlo Lorson, MD

## 2018-05-17 ENCOUNTER — Encounter: Payer: Self-pay | Admitting: Family Medicine

## 2018-05-17 DIAGNOSIS — G479 Sleep disorder, unspecified: Secondary | ICD-10-CM

## 2018-05-17 DIAGNOSIS — G43909 Migraine, unspecified, not intractable, without status migrainosus: Secondary | ICD-10-CM | POA: Insufficient documentation

## 2018-05-17 DIAGNOSIS — K219 Gastro-esophageal reflux disease without esophagitis: Secondary | ICD-10-CM

## 2018-05-17 HISTORY — DX: Gastro-esophageal reflux disease without esophagitis: K21.9

## 2018-05-17 HISTORY — DX: Sleep disorder, unspecified: G47.9

## 2018-05-17 HISTORY — DX: Migraine, unspecified, not intractable, without status migrainosus: G43.909

## 2018-07-11 ENCOUNTER — Encounter: Payer: Self-pay | Admitting: Family Medicine

## 2018-07-11 ENCOUNTER — Ambulatory Visit: Payer: Self-pay | Attending: Family Medicine | Admitting: Family Medicine

## 2018-07-11 ENCOUNTER — Other Ambulatory Visit: Payer: Self-pay

## 2018-07-11 DIAGNOSIS — Z79899 Other long term (current) drug therapy: Secondary | ICD-10-CM

## 2018-07-11 DIAGNOSIS — Z87898 Personal history of other specified conditions: Secondary | ICD-10-CM

## 2018-07-11 DIAGNOSIS — G479 Sleep disorder, unspecified: Secondary | ICD-10-CM

## 2018-07-11 DIAGNOSIS — K219 Gastro-esophageal reflux disease without esophagitis: Secondary | ICD-10-CM

## 2018-07-11 DIAGNOSIS — G43909 Migraine, unspecified, not intractable, without status migrainosus: Secondary | ICD-10-CM

## 2018-07-11 MED ORDER — GABAPENTIN 100 MG PO CAPS: 100.0000 mg | ORAL_CAPSULE | Freq: Two times a day (BID) | ORAL | 4 refills | Status: DC

## 2018-07-11 MED ORDER — IBUPROFEN 600 MG PO TABS: 600.0000 mg | ORAL_TABLET | Freq: Three times a day (TID) | ORAL | 4 refills | Status: DC | PRN

## 2018-07-11 MED ORDER — ZONISAMIDE 25 MG PO CAPS: 25.0000 mg | ORAL_CAPSULE | Freq: Every day | ORAL | 4 refills | Status: DC

## 2018-07-11 MED ORDER — OMEPRAZOLE 40 MG PO CPDR: 40.0000 mg | DELAYED_RELEASE_CAPSULE | Freq: Every day | ORAL | 4 refills | Status: DC

## 2018-07-11 NOTE — Progress Notes (Signed)
Virtual Visit via Telephone Note  I connected with Tiffany Jones on 07/11/18 at  2:10 PM EDT by telephone and verified that I am speaking with the correct person using two identifiers.   I discussed the limitations, risks, security and privacy concerns of performing an evaluation and management service by telephone and the availability of in person appointments. I also discussed with the patient that there may be a patient responsible charge related to this service. The patient expressed understanding and agreed to proceed.  Patient Location: Home Provider Location: Office Others participating in call: [place], RMA   History of Present Illness:      53 year old female seen in follow-up of migraines, sleep disturbance which is suspected to be due to sleep apnea, acid reflux/GERD and patient with complaint of possible prediabetes or diabetes.  Patient states that she was told back in 2018 while living in [place] that she had abnormal blood work/hemoglobin A1c suggestive of prediabetes.  Patient does not recall the actual number.  Patient states that recently she has started feeling shaky at bedtime and she believes that this is because when she has eaten at 6 PM has worn off by bedtime causing her to feel shaky.  Patient would like to have blood work to see if she might have prediabetes or diabetes.      Patient reports that she did have one migraine headache in April but did not have 600mg  ibuprofen to take to help with her headache pain.  Patient states that her prior neurologist and [place] told her that when she is having a migraine that she should take either 600 mg ibuprofen or at thousand gram acetaminophen plus Neurontin to help with the headache.  She also takes Zonegran as a preventative.  Patient states that her headaches are usually left-sided and she has sensitivity to noise and movement but not to light.  Patient states that when she is having a migraine, movement causes  her symptoms to be worse that she has to lie still.  Headaches can range from a 5 to a 9 on a 0-to-10 scale.      She reports that she continues to take her medication for acid reflux.  She has had no abdominal pain, no nausea or vomiting related to reflux.  She reports that she does have morning headaches and her sleep is often non-restful/nonrestorative.  She also tends to snore during her sleep and sometimes awakens herself from sleep secondary to snoring.  Patient states that she was contacted regarding her sleep study but this was postponed until late May secondary to COVID-19.  Patient states that she is currently on furlough from her job but is expected to be called back within the next few weeks.   Past Medical History:  Diagnosis Date  . GERD (gastroesophageal reflux disease)   . History of abnormal mammogram   . Migraines     Past Surgical History:  Procedure Laterality Date  . Right breast biopsy      Family History  Problem Relation Age of Onset  . Breast cancer Mother   . Hypertension Father     Social History   Tobacco Use  . Smoking status: Never Smoker  . Smokeless tobacco: Never Used  Substance Use Topics  . Alcohol use: Yes    Comment: socially  . Drug use: Never     Allergies  Allergen Reactions  . Sulfa Antibiotics Hives    Review of Systems  Constitutional: Positive for  malaise/fatigue. Negative for chills and fever.  HENT: Negative for congestion and sore throat.        Snoring  Eyes: Negative for blurred vision, double vision and photophobia.  Respiratory: Negative for cough and shortness of breath.   Cardiovascular: Negative for chest pain and palpitations.  Gastrointestinal: Positive for nausea (occasional nausea with migraines). Negative for abdominal pain, constipation, diarrhea, heartburn and vomiting.  Genitourinary: Negative for dysuria and frequency.  Musculoskeletal: Negative for joint pain and myalgias.     Observations/Objective: No  vital signs or physical exam conducted as visit was done via telephone due to limitations/restrictions on in-office visits due to the current COVID-19 pandemic  Assessment and Plan: 1. Migraine without status migrainosus, not intractable, unspecified migraine type Patient states that she only had 1 migraine last month.  Patient states that she is on medication, Zonegran as a preventative and that her neurologist back in [place] told her to take gabapentin along with ibuprofen or acetaminophen as needed for onset of headaches.  Patient is provided with refills of gabapentin, ibuprofen and Zonegran for continued treatment of migraines.  Despite her sulfa allergy, patient has had no issues with use of Zonegran - gabapentin (NEURONTIN) 100 MG capsule; Take 1 capsule (100 mg total) by mouth 2 (two) times daily.  Dispense: 60 capsule; Refill: 4 - ibuprofen (ADVIL) 600 MG tablet; Take 1 tablet (600 mg total) by mouth every 8 (eight) hours as needed. Eat before taking the medication  Dispense: 30 tablet; Refill: 4 - zonisamide (ZONEGRAN) 25 MG capsule; Take 1 capsule (25 mg total) by mouth daily.  Dispense: 30 capsule; Refill: 4  2. Gastroesophageal reflux disease without esophagitis Patient reports that her reflux symptoms are currently stable with the use of omeprazole.  Patient is encouraged to continue to avoid known trigger foods as well as avoidance of late night eating - omeprazole (PRILOSEC) 40 MG capsule; Take 1 capsule (40 mg total) by mouth daily.  Dispense: 30 capsule; Refill: 4  3. Sleep disturbance Patient reports that she has been contacted regarding sleep study scheduling but that due to the current COVID-19 pandemic, her sleep study has been scheduled for sometime later in May  4. History of prediabetes Patient reports that she was told in 2018 when she was living in [place] that she was at increased risk for diabetes.  Patient now states that she is having episodes of shakiness which she  believes may be related to low glucose.  Patient will come into the office next week for lab visit to have BMP and hemoglobin A1c  5. Encounter for long-term current use of medication Patient will have BMP at upcoming lab visit in follow-up of long-term use of medications for the treatment of migraines  Follow Up Instructions: 1 week lab visit and four-month follow-up of chronic issues; sooner if labs abnormal or any concerns    I discussed the assessment and treatment plan with the patient. The patient was provided an opportunity to ask questions and all were answered. The patient agreed with the plan and demonstrated an understanding of the instructions.   The patient was advised to call back or seek an in-person evaluation if the symptoms worsen or if the condition fails to improve as anticipated.  I provided 12 minutes of non-face-to-face time during this encounter.   Cain Saupeammie Killian Schwer, MD

## 2018-07-11 NOTE — Progress Notes (Signed)
Per pt she's having a issue at night where she feels shaky. Per pt she was told she was pre diabetic a few years ago when she was in [place]. Per pt she feels shaky even after she eats right before bed.   Per pt this happens when she's hungry but she don't always have to be hungry.

## 2018-07-18 ENCOUNTER — Other Ambulatory Visit: Payer: Self-pay

## 2018-07-18 ENCOUNTER — Ambulatory Visit: Payer: Self-pay | Attending: Family Medicine

## 2018-07-18 DIAGNOSIS — Z79899 Other long term (current) drug therapy: Secondary | ICD-10-CM

## 2018-07-18 DIAGNOSIS — Z87898 Personal history of other specified conditions: Secondary | ICD-10-CM

## 2018-07-18 DIAGNOSIS — G43909 Migraine, unspecified, not intractable, without status migrainosus: Secondary | ICD-10-CM

## 2018-07-19 LAB — BASIC METABOLIC PANEL WITH GFR
BUN/Creatinine Ratio: 14 (ref 9–23)
BUN: 12 mg/dL (ref 6–24)
CO2: 22 mmol/L (ref 20–29)
Calcium: 9.5 mg/dL (ref 8.7–10.2)
Chloride: 105 mmol/L (ref 96–106)
Creatinine, Ser: 0.88 mg/dL (ref 0.57–1.00)
GFR calc Af Amer: 88 mL/min/1.73
GFR calc non Af Amer: 76 mL/min/1.73
Glucose: 105 mg/dL — ABNORMAL HIGH (ref 65–99)
Potassium: 4.6 mmol/L (ref 3.5–5.2)
Sodium: 141 mmol/L (ref 134–144)

## 2018-07-19 LAB — HEMOGLOBIN A1C
Est. average glucose Bld gHb Est-mCnc: 120 mg/dL
Hgb A1c MFr Bld: 5.8 % — ABNORMAL HIGH (ref 4.8–5.6)

## 2018-08-10 ENCOUNTER — Other Ambulatory Visit (HOSPITAL_COMMUNITY)
Admission: RE | Admit: 2018-08-10 | Discharge: 2018-08-10 | Disposition: A | Discharge status: Home / Self Care | Payer: HRSA Program | Source: Ambulatory Visit | Attending: Internal Medicine | Admitting: Internal Medicine

## 2018-08-10 DIAGNOSIS — Z1159 Encounter for screening for other viral diseases: Secondary | ICD-10-CM | POA: Diagnosis not present

## 2018-08-10 DIAGNOSIS — Z01812 Encounter for preprocedural laboratory examination: Secondary | ICD-10-CM | POA: Insufficient documentation

## 2018-08-10 LAB — SARS CORONAVIRUS 2 BY RT PCR (HOSPITAL ORDER, PERFORMED IN ~~LOC~~ HOSPITAL LAB): SARS Coronavirus 2: NEGATIVE

## 2018-08-13 ENCOUNTER — Other Ambulatory Visit: Payer: Self-pay

## 2018-08-13 ENCOUNTER — Ambulatory Visit (HOSPITAL_BASED_OUTPATIENT_CLINIC_OR_DEPARTMENT_OTHER): Payer: Self-pay | Attending: Family Medicine | Admitting: Internal Medicine

## 2018-08-13 VITALS — Ht 63.0 in | Wt 270.0 lb

## 2018-08-13 DIAGNOSIS — G4700 Insomnia, unspecified: Secondary | ICD-10-CM | POA: Insufficient documentation

## 2018-08-13 DIAGNOSIS — G479 Sleep disorder, unspecified: Secondary | ICD-10-CM

## 2018-08-13 DIAGNOSIS — R0683 Snoring: Secondary | ICD-10-CM | POA: Insufficient documentation

## 2018-08-19 DIAGNOSIS — G479 Sleep disorder, unspecified: Secondary | ICD-10-CM

## 2018-08-19 NOTE — Progress Notes (Signed)
Please notify patient that her sleep study did not show sleep apnea but did show snoring. Final diagnoses per sleep medicine doctor is snoring and insomnia. Weight loss is encouraged

## 2018-08-19 NOTE — Procedures (Signed)
   Patient Name: Tiffany Jones, Romack Date: 08/13/2018 Gender: Female D.O.B: 1965-07-20 Age (years): 2 Referring Provider: Cammie Fulp Height (inches): 63 Interpreting Physician: Baird Lyons MD, ABSM Weight (lbs): 270 RPSGT: Zadie Rhine BMI: 48 MRN: [id number] Neck Size: 16.00  CLINICAL INFORMATION Sleep Study Type: NPSG Indication for sleep study: Fatigue, Snoring Epworth Sleepiness Score: 5  SLEEP STUDY TECHNIQUE As per the AASM Manual for the Scoring of Sleep and Associated Events v2.3 (April 2016) with a hypopnea requiring 4% desaturations.  The channels recorded and monitored were frontal, central and occipital EEG, electrooculogram (EOG), submentalis EMG (chin), nasal and oral airflow, thoracic and abdominal wall motion, anterior tibialis EMG, snore microphone, electrocardiogram, and pulse oximetry.  MEDICATIONS Medications self-administered by patient taken the night of the study : none reported  SLEEP ARCHITECTURE The study was initiated at 10:06:30 PM and ended at 4:33:28 AM.  Sleep onset time was 35.2 minutes and the sleep efficiency was 69.6%%. The total sleep time was 269.5 minutes.  Stage REM latency was 147.5 minutes.  The patient spent 10.4%% of the night in stage N1 sleep, 74.8%% in stage N2 sleep, 0.0%% in stage N3 and 14.8% in REM.  Alpha intrusion was absent.  Supine sleep was 1.22%.  RESPIRATORY PARAMETERS The overall apnea/hypopnea index (AHI) was 0.2 per hour. There were 0 total apneas, including 0 obstructive, 0 central and 0 mixed apneas. There were 1 hypopneas and 6 RERAs.  The AHI during Stage REM sleep was 1.5 per hour.  AHI while supine was 0.0 per hour.  The mean oxygen saturation was 95.4%. The minimum SpO2 during sleep was 89.0%.  loud snoring was noted during this study.  CARDIAC DATA The 2 lead EKG demonstrated sinus rhythm. The mean heart rate was 56.8 beats per minute. Other EKG findings include: None.  LEG MOVEMENT  DATA The total PLMS were 0 with a resulting PLMS index of 0.0. Associated arousal with leg movement index was 0.0 .  IMPRESSIONS - No significant obstructive sleep apnea occurred during this study (AHI = 0.2/h). - No significant central sleep apnea occurred during this study (CAI = 0.0/h). - The patient had minimal or no oxygen desaturation during the study (Min O2 = 89.0%). Mean sat 95.4%. - The patient snored with loud snoring volume. - No cardiac abnormalities were noted during this study. - Clinically significant periodic limb movements did not occur during sleep. No significant associated arousals. - Frequent arousals and awakening until 1:00 AM, nonspecific.  DIAGNOSIS - Primary snoring - Insomnia  RECOMMENDATIONS - Snoring might be managed with weight loss, sleep position off back and ENT evaluation as appropriate.  - Future re-evaluation may be considered, espcially if there is further weight gain. - Be careful with alcohol, sedatives and other CNS depressants that may worsen sleep apnea and disrupt normal sleep architecture. - Sleep hygiene should be reviewed to assess factors that may improve sleep quality. - Weight management and regular exercise should be initiated or continued if appropriate.  [Electronically signed] 08/19/2018 11:56 AM  Baird Lyons MD, ABSM Diplomate, American Board of Sleep Medicine   NPI: [id number]                          [place] [place] [facility]  ELECTRONICALLY SIGNED ON:  08/19/2018, 11:51 AM [place] PH: [phone]   FX: [phone] [place]

## 2018-08-20 NOTE — Progress Notes (Signed)
Called patient and she stated that someone had already called her to inform her with her results.

## 2018-11-21 ENCOUNTER — Other Ambulatory Visit: Payer: Self-pay

## 2018-11-21 ENCOUNTER — Encounter: Payer: Self-pay | Admitting: Family Medicine

## 2018-11-21 ENCOUNTER — Ambulatory Visit: Payer: Self-pay | Attending: Family Medicine | Admitting: Family Medicine

## 2018-11-21 VITALS — BP 118/78 | HR 68 | Temp 98.4°F | Resp 16 | Wt 279.4 lb

## 2018-11-21 DIAGNOSIS — G43C0 Periodic headache syndromes in child or adult, not intractable: Secondary | ICD-10-CM

## 2018-11-21 DIAGNOSIS — R7303 Prediabetes: Secondary | ICD-10-CM

## 2018-11-21 DIAGNOSIS — K219 Gastro-esophageal reflux disease without esophagitis: Secondary | ICD-10-CM

## 2018-11-21 DIAGNOSIS — J4520 Mild intermittent asthma, uncomplicated: Secondary | ICD-10-CM

## 2018-11-21 DIAGNOSIS — Z6841 Body Mass Index (BMI) 40.0 and over, adult: Secondary | ICD-10-CM

## 2018-11-21 LAB — POCT GLYCOSYLATED HEMOGLOBIN (HGB A1C): HbA1c, POC (prediabetic range): 5.7 % (ref 5.7–6.4)

## 2018-11-21 MED ORDER — GABAPENTIN 100 MG PO CAPS: 100.0000 mg | ORAL_CAPSULE | Freq: Two times a day (BID) | ORAL | 4 refills | Status: DC

## 2018-11-21 MED ORDER — ALBUTEROL SULFATE HFA 108 (90 BASE) MCG/ACT IN AERS: 2.0000 | INHALATION_SPRAY | Freq: Four times a day (QID) | RESPIRATORY_TRACT | 99 refills | Status: DC | PRN

## 2018-11-21 MED ORDER — OMEPRAZOLE 40 MG PO CPDR: 40.0000 mg | DELAYED_RELEASE_CAPSULE | Freq: Every day | ORAL | 4 refills | Status: DC

## 2018-11-21 MED ORDER — ZONISAMIDE 25 MG PO CAPS: 25.0000 mg | ORAL_CAPSULE | Freq: Every day | ORAL | 4 refills | Status: DC

## 2018-11-21 NOTE — Progress Notes (Signed)
Established Patient Office Visit  Subjective:  Patient ID: Tiffany Jones, female    DOB: [birth year]  Age: 53 y.o. MRN: [id number]  CC:  Chief Complaint  Patient presents with  . Follow-up    HPI Tiffany Jones presents for ongoing treatment and medical management of chronic issues including mild intermittent asthma, GERD, recent diagnosis earlier this year of prediabetes and she reports issues with recurrent migraine type headaches for which she saw neurology in the past and was placed on gabapentin and Zonegran and she reports that these medications have helped to decrease the intensity and frequency of her migraines and now she has mild migraine type headaches approximately twice per month and some months no headaches at all.  She would like to have refill of these medications at today's visit.        She reports that since her last visit she has tried to increase her level of exercise as well as follow a low carbohydrate diet as she does not wish to be on medications for prediabetes and also does not wish to become diabetic.  She reports that her asthma has remained well controlled.  She has occasional shortness of breath with more strenuous activity.  No recent wheezing or cough.  She has not had any nighttime awakening secondary to shortness of breath, cough or wheeze.  She does not avoid activities due to the fear of asthma exacerbation and has had no recent emergency department visits due to her asthma.  She does have some occasional nasal congestion related to allergic rhinitis.          She reports that her acid reflux symptoms have been controlled with the use of omeprazole and avoidance of foods which she knows tend to trigger her GERD symptoms.  She denies any abdominal pain-no nausea or vomiting.  No blood in the stool and no dark stools.  She has had no chest pain or palpitations.  No increased cough, no fever or chills, no headaches or dizziness.  She denies peripheral  edema.  Past Medical History:  Diagnosis Date  . GERD (gastroesophageal reflux disease)   . History of abnormal mammogram   . Migraines     Past Surgical History:  Procedure Laterality Date  . Right breast biopsy      Family History  Problem Relation Age of Onset  . Breast cancer Mother   . Hypertension Father     Social History   Socioeconomic History  . Marital status: Legally Separated    Spouse name: Not on file  . Number of children: Not on file  . Years of education: Not on file  . Highest education level: Not on file  Occupational History  . Not on file  Social Needs  . Financial resource strain: Not on file  . Food insecurity    Worry: Not on file    Inability: Not on file  . Transportation needs    Medical: Not on file    Non-medical: Not on file  Tobacco Use  . Smoking status: Never Smoker  . Smokeless tobacco: Never Used  Substance and Sexual Activity  . Alcohol use: Yes    Comment: socially  . Drug use: Never  . Sexual activity: Not Currently  Lifestyle  . Physical activity    Days per week: Not on file    Minutes per session: Not on file  . Stress: Not on file  Relationships  . Social Herbalist on phone: Not  on file    Gets together: Not on file    Attends religious service: Not on file    Active member of club or organization: Not on file    Attends meetings of clubs or organizations: Not on file    Relationship status: Not on file  . Intimate partner violence    Fear of current or ex partner: Not on file    Emotionally abused: Not on file    Physically abused: Not on file    Forced sexual activity: Not on file  Other Topics Concern  . Not on file  Social History Narrative  . Not on file    Outpatient Medications Prior to Visit  Medication Sig Dispense Refill  . gabapentin (NEURONTIN) 100 MG capsule Take 1 capsule (100 mg total) by mouth 2 (two) times daily. 60 capsule 4  . omeprazole (PRILOSEC) 40 MG capsule Take 1  capsule (40 mg total) by mouth daily. 30 capsule 4  . Vitamin D, Ergocalciferol, (DRISDOL) 50000 units CAPS capsule Take 1 capsule (50,000 Units total) by mouth every 7 (seven) days. 5 capsule 2  . zonisamide (ZONEGRAN) 25 MG capsule Take 1 capsule (25 mg total) by mouth daily. 30 capsule 4  . benzonatate (TESSALON) 100 MG capsule Take 1-2 capsules (100-200 mg total) by mouth 3 (three) times daily as needed for cough. (Patient not taking: Reported on 11/21/2018) 21 capsule 0  . clotrimazole-betamethasone (LOTRISONE) cream Apply 1 application topically 2 (two) times daily. X 7 days then as needed 30 g 4  . ibuprofen (ADVIL) 600 MG tablet Take 1 tablet (600 mg total) by mouth every 8 (eight) hours as needed. Eat before taking the medication (Patient not taking: Reported on 11/21/2018) 30 tablet 4  . sulfamethoxazole-trimethoprim (BACTRIM DS,SEPTRA DS) 800-160 MG tablet Take 1 tablet by mouth 2 (two) times daily. (Patient not taking: Reported on 05/10/2018) 14 tablet 0   No facility-administered medications prior to visit.     Allergies  Allergen Reactions  . Sulfa Antibiotics Hives    ROS Review of Systems  Constitutional: Positive for fatigue. Negative for chills and fever.  HENT: Positive for congestion, postnasal drip, rhinorrhea and sneezing. Negative for sinus pressure, sinus pain, sore throat and trouble swallowing.   Respiratory: Positive for shortness of breath (occasional). Negative for cough and wheezing.   Cardiovascular: Negative for chest pain, palpitations and leg swelling.  Gastrointestinal: Negative for abdominal pain, constipation, diarrhea and nausea.  Endocrine: Negative for polydipsia, polyphagia and polyuria.  Genitourinary: Negative for dysuria and frequency.  Musculoskeletal: Negative for arthralgias and back pain.  Neurological: Positive for headaches (has migraines but controlled with current medications). Negative for dizziness.  Hematological: Negative for adenopathy.  Does not bruise/bleed easily.      Objective:    Physical Exam  Constitutional: She is oriented to person, place, and time. She appears well-developed and well-nourished.  Morbidly obese female in NAD wearing a face mask as per office COVID protocol; slightly congested/nasal quality to the voice  Neck: Normal range of motion. Neck supple. No JVD present.  Cardiovascular: Normal rate and regular rhythm.  Pulmonary/Chest: Effort normal and breath sounds normal. She has no wheezes.  Abdominal: Soft. There is no abdominal tenderness. There is no rebound and no guarding.  Musculoskeletal:        General: No tenderness or edema.     Comments: No CVA tenderness  Lymphadenopathy:    She has no cervical adenopathy.  Neurological: She is alert and oriented to  person, place, and time.  Skin: Skin is warm and dry.  Psychiatric: She has a normal mood and affect. Her behavior is normal.  Nursing note and vitals reviewed.   BP 118/78 (BP Location: Left Arm, Patient Position: Sitting, Cuff Size: Large)   Pulse 68   Temp 98.4 F (36.9 C) (Oral)   Resp 16   Wt 279 lb 6.4 oz (126.7 kg)   SpO2 98%   BMI 49.49 kg/m  Wt Readings from Last 3 Encounters:  11/21/18 279 lb 6.4 oz (126.7 kg)  08/13/18 270 lb (122.5 kg)  05/10/18 273 lb (123.8 kg)     Health Maintenance Due  Topic Date Due  . HIV Screening  09/22/1981  . TETANUS/TDAP  09/22/1985  . PAP SMEAR-Modifier  09/23/1987  . MAMMOGRAM  09/22/2016  . COLONOSCOPY  09/22/2016  . INFLUENZA VACCINE  10/06/2018    Lab Results  Component Value Date   TSH 1.710 09/19/2017   Lab Results  Component Value Date   WBC 4.4 09/19/2017   HGB 11.6 09/19/2017   HCT 36.2 09/19/2017   MCV 91 09/19/2017   PLT 292 09/19/2017   Lab Results  Component Value Date   NA 141 07/18/2018   K 4.6 07/18/2018   CO2 22 07/18/2018   GLUCOSE 105 (H) 07/18/2018   BUN 12 07/18/2018   CREATININE 0.88 07/18/2018   BILITOT 0.4 09/19/2017   ALKPHOS 79  09/19/2017   AST 12 09/19/2017   ALT 10 09/19/2017   PROT 7.3 09/19/2017   ALBUMIN 4.2 09/19/2017   CALCIUM 9.5 07/18/2018   Lab Results  Component Value Date   CHOL 230 (H) 09/19/2017   Lab Results  Component Value Date   HDL 54 09/19/2017   Lab Results  Component Value Date   LDLCALC 160 (H) 09/19/2017   Lab Results  Component Value Date   TRIG 81 09/19/2017   Lab Results  Component Value Date   CHOLHDL 4.3 09/19/2017   Lab Results  Component Value Date   HGBA1C 5.8 (H) 07/18/2018      Assessment & Plan:  1. Prediabetes Patient with hemoglobin A1c done on 07/18/2018 which was 5.8 consistent with prediabetes.  Patient has tried to make dietary changes since that time.  Patient would like to avoid the use of Metformin if possible.  Patient will be notified of her results and further recommendations based on the results.  Educational material provided as part of AVS on preventing type 2 diabetes.  Again discussed low carbohydrate diet, avoidance of concentrated sweets, drinking mostly water and avoiding nondiet soda and avoidance of fruit juices as well as increasing exercise/level of activity to help with blood sugars and insulin resistance. - HgB A1c  2. Gastroesophageal reflux disease without esophagitis Refill of omeprazole for acid reflux symptoms.  Avoid late night eating and avoid known trigger foods. - omeprazole (PRILOSEC) 40 MG capsule; Take 1 capsule (40 mg total) by mouth daily.  Dispense: 30 capsule; Refill: 4  3. Mild intermittent asthma without complication Patient reports no significant issues with her asthma at this time.  Albuterol refill provided and influenza immunization discussed and encouraged. - albuterol (VENTOLIN HFA) 108 (90 Base) MCG/ACT inhaler; Inhale 2 puffs into the lungs every 6 (six) hours as needed for wheezing or shortness of breath.  Dispense: 18 g; Refill: prn  4. Periodic headache syndrome, not intractable; Migraine Headaches Patient  reports history of possible migraine type headaches which have been helped with gabapentin as well as  Zonegran.  Patient has seen neurology in the past.  She would like to have refill of Zonegran and gabapentin at today's visit as these have helped with headaches. - gabapentin (NEURONTIN) 100 MG capsule; Take 1 capsule (100 mg total) by mouth 2 (two) times daily.  Dispense: 60 capsule; Refill: 4 - zonisamide (ZONEGRAN) 25 MG capsule; Take 1 capsule (25 mg total) by mouth daily.  Dispense: 30 capsule; Refill: 4  5. Morbid Obesity Patient with morbid obesity and recent diagnosis of prediabetes.  Discussed the importance of weight loss to help reduce insulin resistance and prevent progression to type 2 diabetes.  Discussed dietary changes as well as increase level of activity/exercise with goal of weight loss.  An After Visit Summary was printed and given to the patient.  Follow-up: Return in about 5 months (around 04/23/2019) for chronic issues and as needed.   Cain Saupeammie Mileena Rothenberger, MD

## 2018-11-21 NOTE — Patient Instructions (Addendum)
Td Vaccine (Tetanus and Diphtheria): What You Need to Know 1. Why get vaccinated? Tetanus  and diphtheria are very serious diseases. They are rare in the [place] today, but people who do become infected often have severe complications. Td vaccine is used to protect adolescents and adults from both of these diseases. Both tetanus and diphtheria are infections caused by bacteria. Diphtheria spreads from person to person through coughing or sneezing. Tetanus-causing bacteria enter the body through cuts, scratches, or wounds. TETANUS (Lockjaw) causes painful muscle tightening and stiffness, usually all over the body.  It can lead to tightening of muscles in the head and neck so you can't open your mouth, swallow, or sometimes even breathe. Tetanus kills about 1 out of every 10 people who are infected even after receiving the best medical care. DIPHTHERIA can cause a thick coating to form in the back of the throat.  It can lead to breathing problems, paralysis, heart failure, and death. Before vaccines, as many as 200,000 cases of diphtheria and hundreds of cases of tetanus were reported in the [place] each year. Since vaccination began, reports of cases for both diseases have dropped by about 99%. 2. Td vaccine Td vaccine can protect adolescents and adults from tetanus and diphtheria. Td is usually given as a booster dose every 10 years but it can also be given earlier after a severe and dirty wound or burn. Another vaccine, called Tdap, which protects against pertussis in addition to tetanus and diphtheria, is sometimes recommended instead of Td vaccine. Your doctor or the person giving you the vaccine can give you more information. Td may safely be given at the same time as other vaccines. 3. Some people should not get this vaccine  A person who has ever had a life-threatening allergic reaction after a previous dose of any tetanus or diphtheria containing vaccine, OR has a severe allergy  to any part of this vaccine, should not get Td vaccine. Tell the person giving the vaccine about any severe allergies.  Talk to your doctor if you: ? had severe pain or swelling after any vaccine containing diphtheria or tetanus, ? ever had a condition called Guillain Barr Syndrome (GBS), ? aren't feeling well on the day the shot is scheduled. 4. Risks of a vaccine reaction With any medicine, including vaccines, there is a chance of side effects. These are usually mild and go away on their own. Serious reactions are also possible but are rare. Most people who get Td vaccine do not have any problems with it. Mild Problems following Td vaccine: (Did not interfere with activities)  Pain where the shot was given (about 8 people in 10)  Redness or swelling where the shot was given (about 1 person in 4)  Mild fever (rare)  Headache (about 1 person in 4)  Tiredness (about 1 person in 4) Moderate Problems following Td vaccine: (Interfered with activities, but did not require medical attention)  Fever over 102F (rare) Severe Problems following Td vaccine: (Unable to perform usual activities; required medical attention)  Swelling, severe pain, bleeding and/or redness in the arm where the shot was given (rare). Problems that could happen after any vaccine:  People sometimes faint after a medical procedure, including vaccination. Sitting or lying down for about 15 minutes can help prevent fainting, and injuries caused by a fall. Tell your doctor if you feel dizzy, or have vision changes or ringing in the ears.  Some people get severe pain in the shoulder and have   difficulty moving the arm where a shot was given. This happens very rarely.  Any medication can cause a severe allergic reaction. Such reactions from a vaccine are very rare, estimated at fewer than 1 in a million doses, and would happen within a few minutes to a few hours after the vaccination. As with any medicine, there is a  very remote chance of a vaccine causing a serious injury or death. The safety of vaccines is always being monitored. For more information, visit: www.cdc.gov/vaccinesafety/ 5. What if there is a serious reaction? What should I look for?  Look for anything that concerns you, such as signs of a severe allergic reaction, very high fever, or unusual behavior. Signs of a severe allergic reaction can include hives, swelling of the face and throat, difficulty breathing, a fast heartbeat, dizziness, and weakness. These would usually start a few minutes to a few hours after the vaccination. What should I do?  If you think it is a severe allergic reaction or other emergency that can't wait, call 9-1-1 or get the person to the nearest hospital. Otherwise, call your doctor.  Afterward, the reaction should be reported to the Vaccine Adverse Event Reporting System (VAERS). Your doctor might file this report, or you can do it yourself through the VAERS web site at www.vaers.hhs.gov, or by calling 1-800-822-7967. VAERS does not give medical advice. 6. The National Vaccine Injury Compensation Program The National Vaccine Injury Compensation Program (VICP) is a federal program that was created to compensate people who may have been injured by certain vaccines. Persons who believe they may have been injured by a vaccine can learn about the program and about filing a claim by calling 1-800-338-2382 or visiting the VICP website at www.hrsa.gov/vaccinecompensation. There is a time limit to file a claim for compensation. 7. How can I learn more?  Ask your doctor. He or she can give you the vaccine package insert or suggest other sources of information.  Call your local or state health department.  Contact the Centers for Disease Control and Prevention (CDC): ? Call 1-800-232-4636 (1-800-CDC-INFO) ? Visit CDC's website at www.cdc.gov/vaccines Vaccine Information Statement Td Vaccine (06/16/15) This information is  not intended to replace advice given to you by your health care provider. Make sure you discuss any questions you have with your health care provider. Document Released: 12/19/2005 Document Revised: 10/09/2017 Document Reviewed: 10/09/2017 Elsevier Interactive Patient Education  2020 Elsevier Inc.   Influenza Virus Vaccine injection (Fluarix) What is this medicine? INFLUENZA VIRUS VACCINE (in floo EN zuh VAHY ruhs vak SEEN) helps to reduce the risk of getting influenza also known as the flu. This medicine may be used for other purposes; ask your health care provider or pharmacist if you have questions. COMMON BRAND NAME(S): Fluarix, Fluzone What should I tell my health care provider before I take this medicine? They need to know if you have any of these conditions:  bleeding disorder like hemophilia  fever or infection  Guillain-Barre syndrome or other neurological problems  immune system problems  infection with the human immunodeficiency virus (HIV) or AIDS  low blood platelet counts  multiple sclerosis  an unusual or allergic reaction to influenza virus vaccine, eggs, chicken proteins, latex, gentamicin, other medicines, foods, dyes or preservatives  pregnant or trying to get pregnant  breast-feeding How should I use this medicine? This vaccine is for injection into a muscle. It is given by a health care professional. A copy of Vaccine Information Statements will be given before each   each vaccination. Read this sheet carefully each time. The sheet may change frequently. Talk to your pediatrician regarding the use of this medicine in children. Special care may be needed. Overdosage: If you think you have taken too much of this medicine contact a poison control center or emergency room at once. NOTE: This medicine is only for you. Do not share this medicine with others. What if I miss a dose? This does not apply. What may interact with this medicine?  chemotherapy or  radiation therapy  medicines that lower your immune system like etanercept, anakinra, infliximab, and adalimumab  medicines that treat or prevent blood clots like warfarin  phenytoin  steroid medicines like prednisone or cortisone  theophylline  vaccines This list may not describe all possible interactions. Give your health care provider a list of all the medicines, herbs, non-prescription drugs, or dietary supplements you use. Also tell them if you smoke, drink alcohol, or use illegal drugs. Some items may interact with your medicine. What should I watch for while using this medicine? Report any side effects that do not go away within 3 days to your doctor or health care professional. Call your health care provider if any unusual symptoms occur within 6 weeks of receiving this vaccine. You may still catch the flu, but the illness is not usually as bad. You cannot get the flu from the vaccine. The vaccine will not protect against colds or other illnesses that may cause fever. The vaccine is needed every year. What side effects may I notice from receiving this medicine? Side effects that you should report to your doctor or health care professional as soon as possible:  allergic reactions like skin rash, itching or hives, swelling of the face, lips, or tongue Side effects that usually do not require medical attention (report to your doctor or health care professional if they continue or are bothersome):  fever  headache  muscle aches and pains  pain, tenderness, redness, or swelling at site where injected  weak or tired This list may not describe all possible side effects. Call your doctor for medical advice about side effects. You may report side effects to FDA at 1-800-FDA-1088. Where should I keep my medicine? This vaccine is only given in a clinic, pharmacy, doctor's office, or other health care setting and will not be stored at home. NOTE: This sheet is a summary. It may not  cover all possible information. If you have questions about this medicine, talk to your doctor, pharmacist, or health care provider.  2020 Elsevier/Gold Standard (2007-09-19 09:30:40)  Preventing Type 2 Diabetes Mellitus Type 2 diabetes (type 2 diabetes mellitus) is a long-term (chronic) disease that affects blood sugar (glucose) levels. Normally, a hormone called insulin allows glucose to enter cells in the body. The cells use glucose for energy. In type 2 diabetes, one or both of these problems may be present:  The body does not make enough insulin.  The body does not respond properly to insulin that it makes (insulin resistance). Insulin resistance or lack of insulin causes excess glucose to build up in the blood instead of going into cells. As a result, high blood glucose (hyperglycemia) develops, which can cause many complications. Being overweight or obese and having an inactive (sedentary) lifestyle can increase your risk for diabetes. Type 2 diabetes can be delayed or prevented by making certain nutrition and lifestyle changes. What nutrition changes can be made?   Eat healthy meals and snacks regularly. Keep a healthy snack with you  for when you get hungry between meals, such as fruit or a handful of nuts.  Eat lean meats and proteins that are low in saturated fats, such as chicken, fish, egg whites, and beans. Avoid processed meats.  Eat plenty of fruits and vegetables and plenty of grains that have not been processed (whole grains). It is recommended that you eat: ? 1?2 cups of fruit every day. ? 2?3 cups of vegetables every day. ? 6?8 oz of whole grains every day, such as oats, whole wheat, bulgur, brown rice, quinoa, and millet.  Eat low-fat dairy products, such as milk, yogurt, and cheese.  Eat foods that contain healthy fats, such as nuts, avocado, olive oil, and canola oil.  Drink water throughout the day. Avoid drinks that contain added sugar, such as soda or sweet  tea.  Follow instructions from your health care provider about specific eating or drinking restrictions.  Control how much food you eat at a time (portion size). ? Check food labels to find out the serving sizes of foods. ? Use a kitchen scale to weigh amounts of foods.  Saute or steam food instead of frying it. Cook with water or broth instead of oils or butter.  Limit your intake of: ? Salt (sodium). Have no more than 1 tsp (2,400 mg) of sodium a day. If you have heart disease or high blood pressure, have less than ? tsp (1,500 mg) of sodium a day. ? Saturated fat. This is fat that is solid at room temperature, such as butter or fat on meat. What lifestyle changes can be made? Activity   Do moderate-intensity physical activity for at least 30 minutes on at least 5 days of the week, or as much as told by your health care provider.  Ask your health care provider what activities are safe for you. A mix of physical activities may be best, such as walking, swimming, cycling, and strength training.  Try to add physical activity into your day. For example: ? Park in spots that are farther away than usual, so that you walk more. For example, park in a far corner of the parking lot when you go to the office or the grocery store. ? Take a walk during your lunch break. ? Use stairs instead of elevators or escalators. Weight Loss  Lose weight as directed. Your health care provider can determine how much weight loss is best for you and can help you lose weight safely.  If you are overweight or obese, you may be instructed to lose at least 5?7 % of your body weight. Alcohol and Tobacco   Limit alcohol intake to no more than 1 drink a day for nonpregnant women and 2 drinks a day for men. One drink equals 12 oz of beer, 5 oz of wine, or 1 oz of hard liquor.  Do not use any tobacco products, such as cigarettes, chewing tobacco, and e-cigarettes. If you need help quitting, ask your health care  provider. Work With Your Health Care Provider  Have your blood glucose tested regularly, as told by your health care provider.  Discuss your risk factors and how you can reduce your risk for diabetes.  Get screening tests as told by your health care provider. You may have screening tests regularly, especially if you have certain risk factors for type 2 diabetes.  Make an appointment with a diet and nutrition specialist (registered dietitian). A registered dietitian can help you make a healthy eating plan and can help you  understand portion sizes and food labels. Why are these changes important?  It is possible to prevent or delay type 2 diabetes and related health problems by making lifestyle and nutrition changes.  It can be difficult to recognize signs of type 2 diabetes. The best way to avoid possible damage to your body is to take actions to prevent the disease before you develop symptoms. What can happen if changes are not made?  Your blood glucose levels may keep increasing. Having high blood glucose for a long time is dangerous. Too much glucose in your blood can damage your blood vessels, heart, kidneys, nerves, and eyes.  You may develop prediabetes or type 2 diabetes. Type 2 diabetes can lead to many chronic health problems and complications, such as: ? Heart disease. ? Stroke. ? Blindness. ? Kidney disease. ? Depression. ? Poor circulation in the feet and legs, which could lead to surgical removal (amputation) in severe cases. Where to find support  Ask your health care provider to recommend a registered dietitian, diabetes educator, or weight loss program.  Look for local or online weight loss groups.  Join a gym, fitness club, or outdoor activity group, such as a walking club. Where to find more information To learn more about diabetes and diabetes prevention, visit:  American Diabetes Association (ADA): www.diabetes.[employer] of Diabetes and Digestive  and Kidney Diseases: FindSpin.nl To learn more about healthy eating, visit:  The U.S. Department of Agriculture Scientist, research (physical sciences)), Choose My Plate: http://wiley-williams.com/  Office of Disease Prevention and Health Promotion (ODPHP), Dietary Guidelines: SurferLive.at Summary  You can reduce your risk for type 2 diabetes by increasing your physical activity, eating healthy foods, and losing weight as directed.  Talk with your health care provider about your risk for type 2 diabetes. Ask about any blood tests or screening tests that you need to have. This information is not intended to replace advice given to you by your health care provider. Make sure you discuss any questions you have with your health care provider. Document Released: 06/15/2015 Document Revised: 06/15/2018 Document Reviewed: 04/14/2015 Elsevier Patient Education  2020 [employer].

## 2018-12-08 ENCOUNTER — Other Ambulatory Visit: Payer: Self-pay

## 2018-12-08 ENCOUNTER — Encounter (HOSPITAL_COMMUNITY): Payer: Self-pay | Admitting: Emergency Medicine

## 2018-12-08 ENCOUNTER — Ambulatory Visit (HOSPITAL_COMMUNITY)
Admission: EM | Admit: 2018-12-08 | Discharge: 2018-12-08 | Disposition: A | Discharge status: Home / Self Care | Payer: Self-pay | Attending: Emergency Medicine | Admitting: Emergency Medicine

## 2018-12-08 DIAGNOSIS — N898 Other specified noninflammatory disorders of vagina: Secondary | ICD-10-CM | POA: Insufficient documentation

## 2018-12-08 DIAGNOSIS — N760 Acute vaginitis: Secondary | ICD-10-CM | POA: Insufficient documentation

## 2018-12-08 LAB — HIV ANTIBODY (ROUTINE TESTING W REFLEX): HIV Screen 4th Generation wRfx: NONREACTIVE

## 2018-12-08 LAB — POCT URINALYSIS DIP (DEVICE)
Bilirubin Urine: NEGATIVE
Glucose, UA: NEGATIVE mg/dL
Ketones, ur: NEGATIVE mg/dL
Nitrite: NEGATIVE
Protein, ur: NEGATIVE mg/dL
Specific Gravity, Urine: 1.025 (ref 1.005–1.030)
Urobilinogen, UA: 0.2 mg/dL (ref 0.0–1.0)
pH: 5.5 (ref 5.0–8.0)

## 2018-12-08 MED ORDER — FLUCONAZOLE 150 MG PO TABS: 150.0000 mg | ORAL_TABLET | Freq: Once | ORAL | 1 refills | Status: AC

## 2018-12-08 MED ORDER — METRONIDAZOLE 500 MG PO TABS: 500.0000 mg | ORAL_TABLET | Freq: Two times a day (BID) | ORAL | 0 refills | Status: AC

## 2018-12-08 NOTE — ED Provider Notes (Signed)
HPI  SUBJECTIVE:  Tiffany Jones is a 53 y.o. female who presents with 2 weeks of odorous yellowish, reddish-brownish vaginal discharge, vulvar itching and irritation.  She notes specks of blood on the toilet paper after she wipes herself hard, but denies vaginal bleeding.  No genital blisters, fevers, body aches, back, abdominal, pelvic pain.  She reports urinary frequency, but no dysuria, urgency, cloudy or odorous urine, hematuria.  She wears panty liners every day due to the discharge.  She is sexually active with a new female partner who she thinks has been unfaithful to her.  She is not sure if he is having symptoms, they have not talked in 2 weeks.  She has tried over-the-counter yeast medications with some improvement in her symptoms.  No aggravating factors.  No recent antibiotics.  No new perfumed soaps or body washes.  She has a past medical history of trichomonas, BV, yeast, frequent UTIs, ectopic pregnancy x2, prediabetes, allergies.  No history of pyelonephritis, nephrolithiasis, gonorrhea, chlamydia, HIV, HSV, syphilis, PID, hypertension.  LMP: 1 year ago.  PMD: Cain Saupe, MD   Past Medical History:  Diagnosis Date  . GERD (gastroesophageal reflux disease)   . History of abnormal mammogram   . Migraines     Past Surgical History:  Procedure Laterality Date  . Right breast biopsy      Family History  Problem Relation Age of Onset  . Breast cancer Mother   . Hypertension Father     Social History   Tobacco Use  . Smoking status: Never Smoker  . Smokeless tobacco: Never Used  Substance Use Topics  . Alcohol use: Yes    Comment: socially  . Drug use: Never    No current facility-administered medications for this encounter.   Current Outpatient Medications:  .  albuterol (VENTOLIN HFA) 108 (90 Base) MCG/ACT inhaler, Inhale 2 puffs into the lungs every 6 (six) hours as needed for wheezing or shortness of breath., Disp: 18 g, Rfl: prn .   clotrimazole-betamethasone (LOTRISONE) cream, Apply 1 application topically 2 (two) times daily. X 7 days then as needed, Disp: 30 g, Rfl: 4 .  fluconazole (DIFLUCAN) 150 MG tablet, Take 1 tablet (150 mg total) by mouth once for 1 dose. 1 tab po x 1. May repeat in 72 hours if no improvement, Disp: 2 tablet, Rfl: 1 .  gabapentin (NEURONTIN) 100 MG capsule, Take 1 capsule (100 mg total) by mouth 2 (two) times daily., Disp: 60 capsule, Rfl: 4 .  metroNIDAZOLE (FLAGYL) 500 MG tablet, Take 1 tablet (500 mg total) by mouth 2 (two) times daily for 7 days., Disp: 14 tablet, Rfl: 0 .  omeprazole (PRILOSEC) 40 MG capsule, Take 1 capsule (40 mg total) by mouth daily., Disp: 30 capsule, Rfl: 4 .  Vitamin D, Ergocalciferol, (DRISDOL) 50000 units CAPS capsule, Take 1 capsule (50,000 Units total) by mouth every 7 (seven) days., Disp: 5 capsule, Rfl: 2 .  zonisamide (ZONEGRAN) 25 MG capsule, Take 1 capsule (25 mg total) by mouth daily., Disp: 30 capsule, Rfl: 4  Allergies  Allergen Reactions  . Sulfa Antibiotics Hives     ROS  As noted in HPI.   Physical Exam  BP (!) 151/84 (BP Location: Right Arm)   Pulse 66   Temp 98.5 F (36.9 C) (Oral)   Resp 18   SpO2 96%   Constitutional: Well developed, well nourished, no acute distress Eyes:  EOMI, conjunctiva normal bilaterally HENT: Normocephalic, atraumatic,mucus membranes moist Respiratory: Normal inspiratory effort Cardiovascular:  Normal rate GI: nondistended soft, nontender. No suprapubic tenderness  back: No CVA tenderness GU: Deferred skin: No rash, skin intact Musculoskeletal: no deformities Neurologic: Alert & oriented x 3, no focal neuro deficits Psychiatric: Speech and behavior appropriate   ED Course   Medications - No data to display  Orders Placed This Encounter  Procedures  . Urine culture    Standing Status:   Standing    Number of Occurrences:   1    Order Specific Question:   List patient's active antibiotics    Answer:    none  . RPR    Standing Status:   Standing    Number of Occurrences:   1  . HIV antibody    Standing Status:   Standing    Number of Occurrences:   1  . POCT urinalysis dip (device)    Standing Status:   Standing    Number of Occurrences:   1    Results for orders placed or performed during the hospital encounter of 12/08/18 (from the past 24 hour(s))  POCT urinalysis dip (device)     Status: Abnormal   Collection Time: 12/08/18  1:42 PM  Result Value Ref Range   Glucose, UA NEGATIVE NEGATIVE mg/dL   Bilirubin Urine NEGATIVE NEGATIVE   Ketones, ur NEGATIVE NEGATIVE mg/dL   Specific Gravity, Urine 1.025 1.005 - 1.030   Hgb urine dipstick TRACE (A) NEGATIVE   pH 5.5 5.0 - 8.0   Protein, ur NEGATIVE NEGATIVE mg/dL   Urobilinogen, UA 0.2 0.0 - 1.0 mg/dL   Nitrite NEGATIVE NEGATIVE   Leukocytes,Ua SMALL (A) NEGATIVE   No results found.  ED Clinical Impression  1. Vaginal discharge   2. Acute vaginitis     ED Assessment/Plan  H&P most c/w yeast infection vs BV. Sent off GC/chlamydia, wet prep, HIV, RPR. Will not treat empirically now-patient wants to wait for labs. Will send home with flagyl, diflucan for yeast infection.  Pyridium for urinary frequency.  While patient has some hematuria and small leukocytes, she states that she is not a fan of taking medications unless she absolutely has to.  Sending urine off for culture prior to initiating antibiotic treatment for urinary tract infection.  Of note, she states that she cannot tolerate Bactrim.  Suspect the urine dip is from the vaginal discharge/irritation.  Advised pt to refrain from sexual contact until she knows lab results, symptoms resolve, and partner(s) are treated if necessary. Pt provided working phone number. Follow-up with PMD as needed. Discussed labs, MDM, plan with patient. Pt agrees with plan.   Meds ordered this encounter  Medications  . metroNIDAZOLE (FLAGYL) 500 MG tablet    Sig: Take 1 tablet (500 mg total)  by mouth 2 (two) times daily for 7 days.    Dispense:  14 tablet    Refill:  0  . fluconazole (DIFLUCAN) 150 MG tablet    Sig: Take 1 tablet (150 mg total) by mouth once for 1 dose. 1 tab po x 1. May repeat in 72 hours if no improvement    Dispense:  2 tablet    Refill:  1    *This clinic note was created using Lobbyist. Therefore, there may be occasional mistakes despite careful proofreading.  ?    Melynda Ripple, MD 12/08/18 1422

## 2018-12-08 NOTE — ED Triage Notes (Signed)
Pt here for vaginal discharge  

## 2018-12-08 NOTE — Discharge Instructions (Signed)
Urine gonorrhea, chlamydia, HIV, syphilis tests are pending.  I am also checking for trichomonas, BV and yeast.  I am treating you for BV with Flagyl and yeast with the Diflucan.  You may continue the external cream that she got from the drugstore to help with the irritation.  Try not wearing a panty liner unless you absolutely have to.  I have sent her urine off for culture to make absolutely sure that you do not have a urinary tract infection, if you do, we will contact you and initiate the appropriate antibiotics.

## 2018-12-09 LAB — RPR: RPR Ser Ql: NONREACTIVE

## 2018-12-09 LAB — URINE CULTURE: Culture: <10000 — AB

## 2018-12-11 LAB — CERVICOVAGINAL ANCILLARY ONLY
Bacterial vaginitis: NEGATIVE
Candida vaginitis: NEGATIVE
Chlamydia: NEGATIVE
Neisseria Gonorrhea: NEGATIVE
Trichomonas: POSITIVE — AB

## 2018-12-12 ENCOUNTER — Telehealth (HOSPITAL_COMMUNITY): Payer: Self-pay | Admitting: Emergency Medicine

## 2018-12-12 NOTE — Telephone Encounter (Signed)
Trichomonas is positive. Rx metronidazole was given at the urgent care visit. Pt needs education to please refrain from sexual intercourse for 7 days to give the medicine time to work. Sexual partners need to be notified and tested/treated. Condoms may reduce risk of reinfection. Recheck for further evaluation if symptoms are not improving.   Patient contacted and made aware of    results, all questions answered   

## 2018-12-23 ENCOUNTER — Encounter: Payer: Self-pay | Admitting: Family Medicine

## 2019-02-15 ENCOUNTER — Ambulatory Visit: Payer: Self-pay | Attending: Family Medicine | Admitting: Family Medicine

## 2019-02-15 ENCOUNTER — Other Ambulatory Visit: Payer: Self-pay

## 2019-02-15 DIAGNOSIS — K219 Gastro-esophageal reflux disease without esophagitis: Secondary | ICD-10-CM

## 2019-02-15 DIAGNOSIS — R7303 Prediabetes: Secondary | ICD-10-CM

## 2019-02-15 DIAGNOSIS — Z87898 Personal history of other specified conditions: Secondary | ICD-10-CM

## 2019-02-15 DIAGNOSIS — G43C0 Periodic headache syndromes in child or adult, not intractable: Secondary | ICD-10-CM

## 2019-02-15 MED ORDER — ZONISAMIDE 25 MG PO CAPS: 25.0000 mg | ORAL_CAPSULE | Freq: Every day | ORAL | 5 refills | Status: DC

## 2019-02-15 MED ORDER — OMEPRAZOLE 40 MG PO CPDR: 40.0000 mg | DELAYED_RELEASE_CAPSULE | Freq: Every day | ORAL | 5 refills | Status: DC

## 2019-02-15 MED ORDER — GABAPENTIN 100 MG PO CAPS: 100.0000 mg | ORAL_CAPSULE | Freq: Two times a day (BID) | ORAL | 5 refills | Status: DC

## 2019-02-15 NOTE — Progress Notes (Signed)
Virtual Visit via Telephone Note  I connected with Tiffany Jones on 02/15/19 at  4:10 PM EST by telephone and verified that I am speaking with the correct person using two identifiers.   I discussed the limitations, risks, security and privacy concerns of performing an evaluation and management service by telephone and the availability of in person appointments. I also discussed with the patient that there may be a patient responsible charge related to this service. The patient expressed understanding and agreed to proceed.  Patient Location: Home Provider Location: [facility] Others participating in call: none   History of Present Illness:     53 year old female seen in follow-up of chronic medical issues.  She requests refills of medications for treatment of her headache syndrome/migraines, and acid reflux.  She reports prior abnormal mammogram of the right breast but has not scheduled follow-up yet.  She has tried to make dietary changes after being told about diagnosis of prediabetes at a prior visit.  She denies any issues with increased thirst or urinary frequency at this time.  She denies reflux symptoms as long as she takes her omeprazole daily.  Her headaches are controlled and resolved with the use of medication for nausea as well as by taking  Gabapentin.  She denies any current issues with breast pain or breast tenderness, no skin changes and no nipple discharge.  She has not felt as if she has had any enlarged lymph nodes in her armpits.  Overall she feels as if her health is stable at this time.  Past Medical History:  Diagnosis Date  . GERD (gastroesophageal reflux disease)   . History of abnormal mammogram   . Migraines     Past Surgical History:  Procedure Laterality Date  . Right breast biopsy      Family History  Problem Relation Age of Onset  . Breast cancer Mother   . Hypertension Father     Social History   Tobacco Use  . Smoking status: Never Smoker  .  Smokeless tobacco: Never Used  Substance Use Topics  . Alcohol use: Yes    Comment: socially  . Drug use: Never     Allergies  Allergen Reactions  . Sulfa Antibiotics Hives       Observations/Objective: No vital signs or physical exam conducted as visit was done via telephone  Assessment and Plan: 1. Periodic headache syndrome, not intractable Prescriptions provided for Zonegran and gabapentin for patient's periodic headache syndrome/migraines. - zonisamide (ZONEGRAN) 25 MG capsule; Take 1 capsule (25 mg total) by mouth daily.  Dispense: 30 capsule; Refill: 5 - gabapentin (NEURONTIN) 100 MG capsule; Take 1 capsule (100 mg total) by mouth 2 (two) times daily.  Dispense: 60 capsule; Refill: 5  2. Gastroesophageal reflux disease without esophagitis Prescription for omeprazole 40 mg daily for acid reflux treatment.  Avoid known trigger foods, avoid spicy/greasy foods as well as nonsteroidal anti-inflammatories and avoid late night eating within 2 hours of bedtime - omeprazole (PRILOSEC) 40 MG capsule; Take 1 capsule (40 mg total) by mouth daily.  Dispense: 30 capsule; Refill: 5  3. History of abnormal mammogram Patient with history of abnormal mammogram and she will be rescheduled for diagnostic mammogram and ultrasound of the right breast/axilla - MM Digital Diagnostic Bilat; Future - US BREAST COMPLETE UNI RIGHT INC AXILLA; Future  4. Prediabetes Patient with abnormal hemoglobin A1c of 5.7 on 11/21/2018 and patient has been asked to schedule an appointment for repeat hemoglobin A1c.  Low carbohydrate/no  concentrated sweets diet encouraged - Hemoglobin A1C; Future  Follow Up Instructions:Return in about 4 months (around 06/16/2019) for Chronic issues and as needed; lab visit in the next 2 to 3 weeks.    I discussed the assessment and treatment plan with the patient. The patient was provided an opportunity to ask questions and all were answered. The patient agreed with the plan and  demonstrated an understanding of the instructions.   The patient was advised to call back or seek an in-person evaluation if the symptoms worsen or if the condition fails to improve as anticipated.  I provided 12 minutes of non-face-to-face time during this encounter.   Antony Blackbird, MD

## 2019-02-24 ENCOUNTER — Encounter: Payer: Self-pay | Admitting: Family Medicine

## 2019-03-11 ENCOUNTER — Ambulatory Visit (HOSPITAL_COMMUNITY)
Admission: EM | Admit: 2019-03-11 | Discharge: 2019-03-11 | Disposition: A | Discharge status: Home / Self Care | Payer: 59 | Attending: Family Medicine | Admitting: Family Medicine

## 2019-03-11 ENCOUNTER — Other Ambulatory Visit: Payer: Self-pay

## 2019-03-11 ENCOUNTER — Encounter (HOSPITAL_COMMUNITY): Payer: Self-pay

## 2019-03-11 DIAGNOSIS — Z20822 Contact with and (suspected) exposure to covid-19: Secondary | ICD-10-CM | POA: Diagnosis not present

## 2019-03-11 DIAGNOSIS — R519 Headache, unspecified: Secondary | ICD-10-CM

## 2019-03-11 DIAGNOSIS — Z803 Family history of malignant neoplasm of breast: Secondary | ICD-10-CM | POA: Insufficient documentation

## 2019-03-11 DIAGNOSIS — U071 COVID-19: Secondary | ICD-10-CM | POA: Diagnosis not present

## 2019-03-11 DIAGNOSIS — J069 Acute upper respiratory infection, unspecified: Secondary | ICD-10-CM | POA: Diagnosis present

## 2019-03-11 DIAGNOSIS — Z882 Allergy status to sulfonamides status: Secondary | ICD-10-CM | POA: Insufficient documentation

## 2019-03-11 DIAGNOSIS — J45909 Unspecified asthma, uncomplicated: Secondary | ICD-10-CM | POA: Diagnosis not present

## 2019-03-11 HISTORY — DX: Unspecified asthma, uncomplicated: J45.909

## 2019-03-11 MED ORDER — BENZONATATE 100 MG PO CAPS: ORAL_CAPSULE | ORAL | 0 refills | Status: DC

## 2019-03-11 NOTE — Discharge Instructions (Addendum)
If your Covid-19 test is positive, you will receive a phone call from [facility] regarding your results. Negative test results are not called. Both positive and negative results area always visible on MyChart. If you do not have a MyChart account, sign up instructions are in your discharge papers.  

## 2019-03-11 NOTE — ED Triage Notes (Signed)
Patient presents to Urgent Care with complaints of non-prod cough, HA, SOB, chills, upper back pain/discomfort since 03/08/18. Last dose tylenol at 0200. Denies sore throat, n/v/d, no abd pain, denies loss of taste/smell.  Patient reports h/o asthma. Last tested for COVID on 12/29 at work--negative result. Denies dysuria s/s, cp, or syncope.  No acute apparent distress noted. Provider notified of BP.

## 2019-03-13 NOTE — ED Provider Notes (Signed)
[facility]   [id number] 03/11/19 Arrival Time: 1111  ASSESSMENT & PLAN:  1. Viral URI with cough   2. Nonintractable headache, unspecified chronicity pattern, unspecified headache type      COVID-19 testing sent. To self-quarantine until results are available. If requested, work note provided.  Follow-up Information    Cain Saupe, MD.   Specialty: Family Medicine Why: As needed. Contact information: [address] [place] [address] [phone]        [facility].   Specialty: Urgent Care Why: If symptoms worsen in any way. Contact information: [address] [place] [address] [phone]          Reviewed expectations re: course of current medical issues. Questions answered. Outlined signs and symptoms indicating need for more acute intervention. Patient verbalized understanding. After Visit Summary given.   SUBJECTIVE: History from: patient. Tiffany Jones is a 54 y.o. female who requests COVID-19 testing. Known COVID-19 contact: none. Recent travel: none. Reports headache, occasional SOB, chills, back discomfort for the past few days. Denies: sore throat. Normal PO intake without n/v/d. Tested neg for COVID on 12/29.  ROS: As per HPI.   OBJECTIVE:  Vitals:   03/11/19 1222  BP: (!) 149/95  Pulse: 74  Resp: 20  Temp: 99.2 F (37.3 C)  TempSrc: Oral  SpO2: 100%    General appearance: alert; no distress Eyes: PERRLA; EOMI; conjunctiva normal HENT: [place]; AT; nasal mucosa normal; oral mucosa normal Neck: supple  Lungs: speaks full sentences without difficulty; unlabored Heart: regular rate and rhythm Abdomen: soft, non-tender Extremities: no edema Skin: warm and dry Neurologic: normal gaitPsychological: alert and cooperative; normal mood and affect  Labs: Labs Reviewed  NOVEL CORONAVIRUS, NAA (HOSP ORDER, SEND-OUT TO REF LAB; TAT 18-24 HRS)      Allergies    Allergen Reactions  . Sulfa Antibiotics Hives    Past Medical History:  Diagnosis Date  . Asthma   . GERD (gastroesophageal reflux disease)   . History of abnormal mammogram   . Migraines    Social History   Socioeconomic History  . Marital status: Widowed    Spouse name: Not on file  . Number of children: Not on file  . Years of education: Not on file  . Highest education level: Not on file  Occupational History  . Not on file  Tobacco Use  . Smoking status: Never Smoker  . Smokeless tobacco: Never Used  Substance and Sexual Activity  . Alcohol use: Yes    Comment: socially  . Drug use: Never  . Sexual activity: Not Currently  Other Topics Concern  . Not on file  Social History Narrative  . Not on file   Social Determinants of Health   Financial Resource Strain:   . Difficulty of Paying Living Expenses: Not on file  Food Insecurity:   . Worried About Programme researcher, broadcasting/film/video in the Last Year: Not on file  . Ran Out of Food in the Last Year: Not on file  Transportation Needs:   . Lack of Transportation (Medical): Not on file  . Lack of Transportation (Non-Medical): Not on file  Physical Activity:   . Days of Exercise per Week: Not on file  . Minutes of Exercise per Session: Not on file  Stress:   . Feeling of Stress : Not on file  Social Connections:   . Frequency of Communication with Friends and Family: Not on file  . Frequency  of Social Gatherings with Friends and Family: Not on file  . Attends Religious Services: Not on file  . Active Member of Clubs or Organizations: Not on file  . Attends Archivist Meetings: Not on file  . Marital Status: Not on file  Intimate Partner Violence:   . Fear of Current or Ex-Partner: Not on file  . Emotionally Abused: Not on file  . Physically Abused: Not on file  . Sexually Abused: Not on file   Family History  Problem Relation Age of Onset  . Breast cancer Mother   . Hypertension Father    Past Surgical  History:  Procedure Laterality Date  . ECTOPIC PREGNANCY SURGERY    . Right breast biopsy       Vanessa Kick, MD 03/13/19 [phone]

## 2019-03-14 LAB — NOVEL CORONAVIRUS, NAA (HOSP ORDER, SEND-OUT TO REF LAB; TAT 18-24 HRS): SARS-CoV-2, NAA: DETECTED — AB

## 2019-03-15 ENCOUNTER — Telehealth: Payer: Self-pay | Admitting: Emergency Medicine

## 2019-03-15 ENCOUNTER — Telehealth: Payer: Self-pay | Admitting: Family Medicine

## 2019-03-15 NOTE — Telephone Encounter (Signed)

## 2019-03-15 NOTE — Telephone Encounter (Signed)
Pt called since she is upset that her appt on Thursday is by phone she is requesting to be seen in person since her BP is out control, please follow up

## 2019-03-18 NOTE — Telephone Encounter (Signed)
Returned pt call pt didn't answer. Left a detailed vm informing pt that she was positive for COVID so that's why her appointment is by phone. I aslo made pt aware that the provider will have her come in for in person appointment 30 days past her beding diagnosed with COVID and if she has any questions or concerns to give me a call

## 2019-03-18 NOTE — Telephone Encounter (Signed)
Routing to nurse

## 2019-03-21 ENCOUNTER — Other Ambulatory Visit: Payer: Self-pay

## 2019-03-21 ENCOUNTER — Ambulatory Visit: Payer: 59 | Attending: Family | Admitting: Family

## 2019-03-21 DIAGNOSIS — I10 Essential (primary) hypertension: Secondary | ICD-10-CM

## 2019-03-21 DIAGNOSIS — U071 COVID-19: Secondary | ICD-10-CM

## 2019-03-21 NOTE — Progress Notes (Signed)
Virtual Visit via Telephone Note  I connected with [employer], on 03/21/2019 at 10:33 AM by telephone due to the COVID-19 pandemic and verified that I am speaking with the correct person using two identifiers.  Due to current restrictions/limitations of in-office visits due to the COVID-19 pandemic, this scheduled clinical appointment was converted to a telehealth visit.   Consent: I discussed the limitations, risks, security and privacy concerns of performing an evaluation and management service by telephone and the availability of in person appointments. I also discussed with the patient that there may be a patient responsible charge related to this service. The patient expressed understanding and agreed to proceed.   Location of Patient: Home  Location of Provider: Clinic   Persons participating in Telemedicine visit: Manda Sickles Laruth Bouchard, [place] Ricky Stabs, NP  History of Present Illness: Patient is  54 year-old [place] American female with past history of GERD, migraine, and sleep disturbance presenting today for blood pressure concerns.  1. High Blood Pressure:  Reports blood pressure "has been out of control". Last blood pressure readings were on last week 147/99 and 131/92. Blood pressure was taken in two different stores on separate days. Reports that she had a headache and felt dizzy on last week which lead her to check her blood pressure. Headaches aggravated by stress. Has taken Tylenol for headache which helps some. No history of hypertension. Has not been on blood pressure medications before. Reports blood pressure typically runs low < 120/80. Denies chest pain. Denies palpitations. Denies fever. Last fever was 48 hours ago. Chills only when she has fever. Denies shortness of breath. Denies leg swelling. Diet consists of salt, fried foods, desserts. Does not drink many sodas. Has not had appetite since being diagnosed with Covid. Consumes alcohol  occasionally. Denies smoking. Does not exercise. 2. Covid-19:  Diagnosed with Covid on March 11, 2019. Denies speaking to any healthcare provider since then. Admits coughing all day.Taking Robitussin since last night which alleviates cough. Admits headaches but cannot recall frequency. Taking Tylenol for headaches the last dose of Tylenol was Tuesday March 19, 2019. Admits nausea sometimes. Denies vomiting. Denies shortness of breath. Denies fever (last was Tuesday March 19, 2019). Denies chills unless associated with fever. Denies chest pain. Denies muscle aches. Sleeping for about 6 hours a night. Reports overall symptoms have improved. Lives with family in the home all of which are Covid positive. Concerns that her job is requesting her to return to work on Monday March 25, 2019. 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 Reports that job also completes weekly Covid testing on each employee.  Past Medical History:  Diagnosis Date  . Asthma   . GERD (gastroesophageal reflux disease)   . History of abnormal mammogram   . Migraines    Allergies  Allergen Reactions  . Sulfa Antibiotics Hives    Current Outpatient Medications on File Prior to Visit  Medication Sig Dispense Refill  . albuterol (VENTOLIN HFA) 108 (90 Base) MCG/ACT inhaler Inhale 2 puffs into the lungs every 6 (six) hours as needed for wheezing or shortness of breath. 18 g prn  . benzonatate (TESSALON) 100 MG capsule Take 1 capsule by mouth every 8 (eight) hours for cough. 21 capsule 0  . gabapentin (NEURONTIN) 100 MG capsule Take 1 capsule (100 mg total) by mouth 2 (two) times daily. 60 capsule 5  . omeprazole (PRILOSEC) 40 MG capsule Take 1 capsule (40 mg total) by mouth daily. 30 capsule 5  . Vitamin  D, Ergocalciferol, (DRISDOL) 50000 units CAPS capsule Take 1 capsule (50,000 Units total) by mouth every 7 (seven) days. (Patient not taking: Reported on 02/15/2019) 5 capsule 2  . zonisamide (ZONEGRAN) 25 MG  capsule Take 1 capsule (25 mg total) by mouth daily. 30 capsule 5   No current facility-administered medications on file prior to visit.    Observations/Objective: Alert and oriented x 3. Not in acute distress. Patient was not examined as this is a tele visit.  Assessment and Plan: 1. High Blood Pressure:  -Monitor blood pressure readings over the next 7 days -Blood pressure goal 130/80 or less -Will need at least 2 additional blood pressure reading at next visit  -Follow-up with Clinical Pharmacist via tele visit within 7 days -Tele visit scheduled as patient tested positive for Covid-19 less than 30 days ago -If headache and dizziness worsens or becomes more severe seek medical attention from the Emergency Department -At least 150 minutes of exercise per week of aerobic exercise -Follow a Healthy Eating Plan - You can do it! -Limit sugary drinks.  Avoid sodas, sweet tea, sport or energy drinks, or fruit drinks.  Drink water, lo-fat milk, or diet drinks. -Limit snack foods.   Cut back on candy, cake, cookies, chips, ice cream.  These are a special treat, only in small amounts. -Eat plenty of vegetables.  Especially dark green, red, and orange vegetables. Aim for at least 3 servings a day. More is better! -Include fruit in your daily diet.  Whole fruit is much healthier than fruit juice! -Limit "white" bread, "white" pasta, "white" rice.   Choose "100% whole grain" products, brown or wild rice. -Avoid fatty meats. Try "Meatless Monday" and choose eggs or beans one day a week.  When eating meat, choose lean meats like chicken, [place], and fish.  Grill, broil, or bake meats instead of frying, and eat poultry without the skin. -Eat less salt.  Avoid frozen pizzas, frozen dinners and salty foods.  Use seasonings other than salt in cooking.  This can help blood pressure and keep you from swelling -Beer, wine and liquor have calories.  If you can safely drink alcohol, limit to 1 drink per day for  women, 2 drinks for men 2. Covid-19:  -Not released to return to work -Continue with quarantine  -Does not meet criteria for ending self-isolation which includes 10 days since symptom onset and 3 days fever free without antipyretics (Tylenol or Ibuprofen) and improvement in respiratory symptoms -If symptoms worsen or become severe seek medical attention at the Emergency Department  Follow Up Instructions: Tele visit scheduled in about 1 week with Clinical Pharmacist, please have at least 2 blood pressure readings.  If symptoms of Covid-19 worsen or become severe seek medical attention at Emergency Department.   I discussed the assessment and treatment plan with the patient. The patient was provided an opportunity to ask questions and all were answered. The patient agreed with the plan and demonstrated an understanding of the instructions.   The patient was advised to call back or seek an in-person evaluation if the symptoms worsen or if the condition fails to improve as anticipated.   I provided 30 minutes total of non-face-to-face time during this encounter including median intraservice time, reviewing previous notes, labs, imaging, medications, management and patient verbalized understanding.    Camillia Herter, NP  [facility] [place], [place]   03/21/2019, 11:30 AM

## 2019-03-21 NOTE — Progress Notes (Signed)
Pt states she is very weak

## 2019-03-22 ENCOUNTER — Other Ambulatory Visit: Payer: 59

## 2019-03-28 ENCOUNTER — Other Ambulatory Visit: Payer: Self-pay

## 2019-03-28 ENCOUNTER — Telehealth (HOSPITAL_COMMUNITY): Payer: Self-pay | Admitting: Family Medicine

## 2019-03-28 ENCOUNTER — Ambulatory Visit: Payer: 59 | Admitting: Pharmacist

## 2019-03-28 NOTE — Telephone Encounter (Signed)
Requests return to work note s/p COVID infection. Printed.

## 2019-04-04 ENCOUNTER — Other Ambulatory Visit: Payer: 59

## 2019-04-11 ENCOUNTER — Telehealth: Payer: Self-pay | Admitting: Family Medicine

## 2019-04-11 NOTE — Telephone Encounter (Signed)
Pt had covid on jan 2 and is supposed to have the covid shot on the 15 of feb wants to know should she tale it

## 2019-04-11 NOTE — Telephone Encounter (Signed)
Please advice  

## 2019-04-12 NOTE — Telephone Encounter (Signed)
If she has had no symptoms for 10 days before the shot then she can take it. She would have some antibody coverage from her recent COVID infection so if she wanted to delay the shot she could do that as well.

## 2019-04-12 NOTE — Telephone Encounter (Signed)
Spoke with the patient / stated she needs documentation for HR. Please advice!

## 2019-04-12 NOTE — Telephone Encounter (Signed)
Please ask that patient contact human resources at her work place or if there is a health office at her workplace and ask if she can postpone vaccination due to recent illness with ongoing symptoms from COVID-19.

## 2019-04-12 NOTE — Telephone Encounter (Signed)
Pt verbalized not being 100% . Still c /o  lingering cough , tiredness, and using her inhaler. Need documentation for her work to postpone vaccin at this time.

## 2019-04-15 NOTE — Telephone Encounter (Signed)
It appears that patient was seen by another provider on 03/21/2019 who did not release patient to return to work due to continued cough however patient called urgent care on 03/28/2019 and was given a return to work note.  If she was well enough to return to work without significant symptoms then she should be able to have the COVID-19 vaccine.  It also appears that she has a history of asthma therefore COVID-19 immunization would be beneficial to help prevent recurrence of infection.  Please have her schedule telehealth visit here or with urgent care if she continues to have symptoms

## 2019-04-17 NOTE — Telephone Encounter (Signed)
Patient called to speak with [facility]. Please fu at your earliest convenience.

## 2019-04-18 ENCOUNTER — Ambulatory Visit: Payer: 59 | Attending: Family Medicine | Admitting: Family Medicine

## 2019-04-18 ENCOUNTER — Encounter: Payer: Self-pay | Admitting: Family Medicine

## 2019-04-18 DIAGNOSIS — J45901 Unspecified asthma with (acute) exacerbation: Secondary | ICD-10-CM

## 2019-04-18 DIAGNOSIS — R000 Tachycardia, unspecified: Secondary | ICD-10-CM | POA: Diagnosis not present

## 2019-04-18 DIAGNOSIS — Z8616 Personal history of COVID-19: Secondary | ICD-10-CM | POA: Diagnosis not present

## 2019-04-18 MED ORDER — PREDNISONE 20 MG PO TABS: ORAL_TABLET | ORAL | 0 refills | Status: DC

## 2019-04-18 NOTE — Telephone Encounter (Signed)
Pt spoke with Tiffany Jones to f /u concerns/refer to [place] Note

## 2019-04-18 NOTE — Progress Notes (Signed)
Virtual Visit via Telephone Note  I connected with Tiffany Jones on 04/18/19 at 10:50 AM EST by telephone and verified that I am speaking with the correct person using two identifiers.   I discussed the limitations, risks, security and privacy concerns of performing an evaluation and management service by telephone and the availability of in person appointments. I also discussed with the patient that there may be a patient responsible charge related to this service. The patient expressed understanding and agreed to proceed.  Patient Location: Home Provider Location: [facility] Others participating in call: none   History of Present Illness:       54 yo female with positive COVID test on Mar 11, 2019. Per patient she continues to have a cough which is improving. No fever or chills. No headaches or dizziness. She has had episodes of feeling as if her heart is racing at times. She is using an albuterol inhaler but her episodes of heart racing are not related to the albuterol. She also has a history of asthma which she reports was diagnosed as an adult. She states that she was diagnosed with cough variant asthma while living in [place].  She has was diagnosed with COVID-19 on 03/11/2019 and has had a recurrent cough since that time. She had not had any further fever or chills.  She reports that she returned to work after obtaining a return to work note on 03/28/2019 from [facility] urgent care.  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  She was supposed to receive recent COVID-19 immunization through her employer however she states that she did not feel well at that time and did not believe that she should take the COVID-19 vaccination therefore she states that she has been let go from her job.  She reports that she was told that she can reapply for her position  in March.  She reports that she cannot understand why her employer did not retest her for COVID-19 and also why she was not allowed to postpone her COVID-19 immunization as she reports that she was still coughing even after returning to work.  Past Medical History:  Diagnosis Date  . Asthma   . GERD (gastroesophageal reflux disease)   . History of abnormal mammogram   . Migraines     Past Surgical History:  Procedure Laterality Date  . ECTOPIC PREGNANCY SURGERY    . Right breast biopsy      Family History  Problem Relation Age of Onset  . Breast cancer Mother   . Hypertension Father     Social History   Tobacco Use  . Smoking status: Never Smoker  . Smokeless tobacco: Never Used  Substance Use Topics  . Alcohol use: Yes    Comment: socially  . Drug use: Never     Allergies  Allergen Reactions  . Sulfa Antibiotics Hives       Observations/Objective: No vital signs or physical exam conducted as visit was done via telephone  Assessment and Plan: 1. Moderate asthma with exacerbation, unspecified whether persistent; 3.  History of COVID-19 She reports continued cough status post COVID-19 infection for which she had positive test on March 11, 2019 and history of cough variant asthma.  She is to continue use of her albuterol inhaler as needed and prescription provided for prednisone taper to help with  possible asthma exacerbation.  She has also been referred to pulmonology for further evaluation of her cough variant asthma with continued daily cough status post COVID-19 infection.  She unfortunately did not have any in person evaluation for her COVID-19 symptoms on review of chart.  She did have telehealth visit on 03/21/2019 regarding her blood pressure and also reported continued headache and cough and at that time, she was not released to return to work.  Per chart, she contacted [facility] urgent care on 03/28/2019 and obtained a return to work note.  She reports that even though  she returned to work in a healthcare setting, she also had continued cough and was discharged from her position after refusing the COVID-19 vaccine through her workplace.  I discussed the CDC guidelines with the patient regarding return to work and regarding COVID-19 vaccine but patient was somewhat argumentative regarding her dismissal from her job after refusing COVID-19 immunizations though patient felt that she still did not feel well after COVID-19 infection status post her return to work by obtaining a note from urgent care without being seen by them for her symptoms. - predniSONE (DELTASONE) 20 MG tablet; 2 pills once daily for 5 days; take after eating  Dispense: 10 tablet; Refill: 0 - Ambulatory referral to Pulmonology  2. Tachycardia She reports episodes of tachycardia which are unrelated to her use of albuterol for asthma.  Patient will be referred to cardiology for further evaluation and treatment if needed.  Her episodes of tachycardia could be related to her cough variant asthma but as patient has also recently had COVID-19, she will also be referred to cardiology for further evaluation.   Follow Up Instructions:Return in about 2 weeks (around 05/02/2019) for Cough;Go to ED if worsening of cough/shortness of breath or tachycardia.    I discussed the assessment and treatment plan with the patient. The patient was provided an opportunity to ask questions and all were answered. The patient agreed with the plan and demonstrated an understanding of the instructions.   The patient was advised to call back or seek an in-person evaluation if the symptoms worsen or if the condition fails to improve as anticipated.  I provided 16 minutes of non-face-to-face time during this encounter.   Antony Blackbird, MD

## 2019-04-18 NOTE — Progress Notes (Signed)
Patient verified DOB Patient denies pain at this time. Patient denies N/V, fever, diarrhea.  Patient states the cough is lingering. Patient states her appetite is coming back.

## 2019-04-19 ENCOUNTER — Ambulatory Visit: Payer: 59

## 2019-04-21 ENCOUNTER — Encounter: Payer: Self-pay | Admitting: Family Medicine

## 2019-04-23 ENCOUNTER — Other Ambulatory Visit: Payer: 59

## 2019-04-23 ENCOUNTER — Ambulatory Visit: Payer: 59

## 2019-04-24 ENCOUNTER — Other Ambulatory Visit: Payer: Self-pay

## 2019-04-24 ENCOUNTER — Ambulatory Visit (INDEPENDENT_AMBULATORY_CARE_PROVIDER_SITE_OTHER): Payer: 59

## 2019-04-24 ENCOUNTER — Ambulatory Visit (INDEPENDENT_AMBULATORY_CARE_PROVIDER_SITE_OTHER): Payer: 59 | Admitting: Family Medicine

## 2019-04-24 VITALS — BP 138/74 | HR 68 | Temp 98.1°F | Resp 18 | Ht 63.0 in | Wt 275.0 lb

## 2019-04-24 DIAGNOSIS — R059 Cough, unspecified: Secondary | ICD-10-CM

## 2019-04-24 DIAGNOSIS — R0602 Shortness of breath: Secondary | ICD-10-CM | POA: Diagnosis not present

## 2019-04-24 DIAGNOSIS — J45901 Unspecified asthma with (acute) exacerbation: Secondary | ICD-10-CM

## 2019-04-24 DIAGNOSIS — R05 Cough: Secondary | ICD-10-CM

## 2019-04-24 DIAGNOSIS — I10 Essential (primary) hypertension: Secondary | ICD-10-CM | POA: Diagnosis not present

## 2019-04-24 DIAGNOSIS — Z8616 Personal history of COVID-19: Secondary | ICD-10-CM

## 2019-04-24 MED ORDER — PROMETHAZINE-DM 6.25-15 MG/5ML PO SYRP: 5.0000 mL | ORAL_SOLUTION | Freq: Four times a day (QID) | ORAL | 0 refills | Status: DC | PRN

## 2019-04-24 NOTE — Patient Instructions (Signed)

## 2019-04-24 NOTE — Progress Notes (Signed)
Patient ID: Tiffany Jones, female    DOB: [birth year], 54 y.o.   MRN: [id number]  PCP: Cain Saupe, MD  Chief Complaint  Patient presents with  . Cough    completed prednisone 04/23/19-ongoing SOB, cough    Subjective:  HPI Tiffany Jones is a 54 y.o. female presents to [facility] for evaluation of prolonged cough and shortness of breath since COVID-19 diagnosis 03/11/19.   Patient has been followed via telemedicine encounters by PCP. She has been treated with prednisone, has an albuterol inhaler. She reports improvement of symptoms since resolution of cough with completion of prednisone. Endorses post nasal drainage is a chronic intermittent problem related to year long allergies. At present PND is controlled. She has noticed since COVID-19 diagnosis her BP has been elevated and PCP has encouraged home monitoring of BP. BP is slightly elevated I arrival today. She endorses SOB. No recent unintentional weight gain or swelling of lower extremities.    Review of Systems Pertinent negatives listed in HPI  Patient Active Problem List   Diagnosis Date Noted  . Migraine without status migrainosus, not intractable 05/17/2018  . Gastroesophageal reflux disease without esophagitis 05/17/2018  . Sleep disturbance 05/17/2018      Prior to Admission medications   Medication Sig Start Date End Date Taking? Authorizing Provider  albuterol (VENTOLIN HFA) 108 (90 Base) MCG/ACT inhaler Inhale 2 puffs into the lungs every 6 (six) hours as needed for wheezing or shortness of breath. 11/21/18  Yes Fulp, Cammie, MD  gabapentin (NEURONTIN) 100 MG capsule Take 1 capsule (100 mg total) by mouth 2 (two) times daily. 02/15/19  Yes Fulp, Cammie, MD  omeprazole (PRILOSEC) 40 MG capsule Take 1 capsule (40 mg total) by mouth daily. 02/15/19  Yes Fulp, Cammie, MD  zonisamide (ZONEGRAN) 25 MG capsule Take 1 capsule (25 mg total) by mouth daily. 02/15/19  Yes Fulp, Hewitt Shorts, MD    Past  Medical, Surgical Family and Social History reviewed and updated.    Objective:   Today's Vitals   04/24/19 1747  BP: 138/74  Pulse: 68  Resp: 18  Temp: 98.1 F (36.7 C)  TempSrc: Oral  Weight: 275 lb (124.7 kg)  Height: 5\' 3"  (1.6 m)    Wt Readings from Last 3 Encounters:  04/24/19 275 lb (124.7 kg)  11/21/18 279 lb 6.4 oz (126.7 kg)  08/13/18 270 lb (122.5 kg)     Physical Exam Constitutional:      Appearance: Normal appearance. She is obese. She is not ill-appearing or diaphoretic.  Cardiovascular:     Rate and Rhythm: Normal rate and regular rhythm.  Pulmonary:     Effort: No accessory muscle usage, prolonged expiration, respiratory distress or retractions.     Breath sounds: Normal air entry. No decreased air movement or transmitted upper airway sounds. No decreased breath sounds.  Musculoskeletal:     Cervical back: Normal range of motion. No rigidity.  Skin:    General: Skin is warm.  Neurological:     Mental Status: She is alert.  Psychiatric:        Attention and Perception: Attention normal.        Mood and Affect: Mood normal.     Assessment & Plan:  Shortness of breath , stable Plan: Continue Albuterol PRN as prescribed -Follow-up with cardiology (referral placed by PCP)  Moderate asthma with exacerbation, unspecified whether persistent, stable  Plan: Continue Albuterol inhaler PRN as prescribed.  Cough, resolved  Plan: manage asthma symptoms. Recently referred to  cardiology to rule any cardiac involvement as source of persistent cough. Promethazine-DM prescribed for PRN use -take as directed. CXR pending rule out PNA  Hypertension, unspecified type Plan:Follow-up with PCP. Continue to monitor and record BP readings at home    Meds ordered this encounter  Medications  . promethazine-dextromethorphan (PROMETHAZINE-DM) 6.25-15 MG/5ML syrup    Sig: Take 5 mLs by mouth 4 (four) times daily as needed for cough.    Dispense:  140 mL    Refill:  0      -The patient was given clear instructions to go to ER or return to medical center if symptoms do not improve, worsen or new problems develop. The patient verbalized understanding.     Molli Barrows, FNP-C [facility], PRN Provider  [facility] [place], [place] [facility] Phone: [phone] Clinic Fax: [phone] Clinic Hours: 5:30 pm -7:30 pm (Monday-Friday)

## 2019-04-30 ENCOUNTER — Encounter: Payer: Self-pay | Admitting: Cardiology

## 2019-04-30 ENCOUNTER — Telehealth (INDEPENDENT_AMBULATORY_CARE_PROVIDER_SITE_OTHER): Payer: 59 | Admitting: Cardiology

## 2019-04-30 ENCOUNTER — Encounter: Payer: Self-pay | Admitting: *Deleted

## 2019-04-30 VITALS — Ht 63.0 in | Wt 275.0 lb

## 2019-04-30 DIAGNOSIS — R55 Syncope and collapse: Secondary | ICD-10-CM

## 2019-04-30 DIAGNOSIS — R000 Tachycardia, unspecified: Secondary | ICD-10-CM | POA: Diagnosis not present

## 2019-04-30 DIAGNOSIS — R002 Palpitations: Secondary | ICD-10-CM | POA: Diagnosis not present

## 2019-04-30 NOTE — Progress Notes (Signed)
Patient ID: Tiffany Jones, female   DOB: 1965-06-15, 54 y.o.   MRN: [id number] Patient enrolled for [employer] to ship a 30 day cardiac event monitor to her home.  Instructions sent via My Chart message and will be included in monitor kit.

## 2019-04-30 NOTE — Progress Notes (Signed)
Virtual Visit via Video Note   This visit type was conducted due to national recommendations for restrictions regarding the COVID-19 Pandemic (e.g. social distancing) in an effort to limit this patient's exposure and mitigate transmission in our community.  Due to her co-morbid illnesses, this patient is at least at moderate risk for complications without adequate follow up.  This format is felt to be most appropriate for this patient at this time.  All issues noted in this document were discussed and addressed.  A limited physical exam was performed with this format.  Please refer to the patient's chart for her consent to telehealth for [facility].   Evaluation Performed:  Cardiology Consult  This visit type was conducted due to national recommendations for restrictions regarding the COVID-19 Pandemic (e.g. social distancing).  This format is felt to be most appropriate for this patient at this time.  All issues noted in this document were discussed and addressed.  No physical exam was performed (except for noted visual exam findings with Video Visits).  Please refer to the patient's chart (MyChart message for video visits and phone note for telephone visits) for the patient's consent to telehealth for [facility].  Date:  04/30/2019   ID:  Tiffany Jones, DOB 1966-02-02, MRN [id number]  Patient Location:  Home  Provider location:   [place]  PCP:  Antony Blackbird, MD  Cardiologist:  NEW Electrophysiologist:  None   Chief Complaint:  Tachycardia  History of Present Illness:    Tiffany Jones is a 54 y.o. female who presents via audio/video conferencing for a telehealth visit today in referral for evaluation of tachycardia by Antony Blackbird, MD  This is a 54yo female with a hx of asthma, GERD and migraine HAs who has been having tachycardia and is now referred by her PCP.  She was dx with COVID 19 in January and had a cough that she was using Albuterol for.  Recently she  has noticed that her BP and HR have been elevated.  The palpitations are not related to using her Albuterol.  Since having COVID she has had frequent bouts of palpitations in which her heart is beating very fast.  The palpitation usually occur a few times weekly but sometimes daily.  She denies any SOB, dizziness or syncope with the palpitations.    She did have some dizziness and had a syncopal episode in the bathroom when she was sick with COVID 19.  She also says that she has been hospitalized in the passed after having syncope while in the bathroom and workup was normal.  This was in [place] in 2012.  She has had syncope since then but never sought medical attention in 2015.   Usually she is sitting going to the bathroom and feels lightheaded and nauseated and then has syncope.  This occurs with urination or BM.  Apparently she was tested in [place] to assess for vasovagal syncope that sounded like a Tilt Table Test but she refused the test.   She denies any chest pain or pressure, LE edema, PND or orthopnea.  She has never smoked.  Her paternal aunt died of an MI.  Her mother died from breast CA.  Her father is alive with no cardiac problems except HLD and HTN and is an alcoholic. Her sister died recently from possible drug overdose but had DCM and CHF.   The patient does not have symptoms concerning for COVID-19 infection (fever, chills, cough, or new shortness of breath).   Prior  CV studies:   The following studies were reviewed today:  none  Past Medical History:  Diagnosis Date  . Asthma   . Gastroesophageal reflux disease without esophagitis 05/17/2018  . GERD (gastroesophageal reflux disease)   . History of abnormal mammogram   . Migraine without status migrainosus, not intractable 05/17/2018  . Migraines   . Sleep disturbance 05/17/2018   Past Surgical History:  Procedure Laterality Date  . ECTOPIC PREGNANCY SURGERY    . Right breast biopsy       Current Meds  Medication Sig  .  albuterol (VENTOLIN HFA) 108 (90 Base) MCG/ACT inhaler Inhale 2 puffs into the lungs every 6 (six) hours as needed for wheezing or shortness of breath.  . gabapentin (NEURONTIN) 100 MG capsule Take 1 capsule (100 mg total) by mouth 2 (two) times daily.  Marland Kitchen omeprazole (PRILOSEC) 40 MG capsule Take 1 capsule (40 mg total) by mouth daily.  . promethazine-dextromethorphan (PROMETHAZINE-DM) 6.25-15 MG/5ML syrup Take 5 mLs by mouth 4 (four) times daily as needed for cough.  . zonisamide (ZONEGRAN) 25 MG capsule Take 1 capsule (25 mg total) by mouth daily.     Allergies:   Sulfa antibiotics   Social History   Tobacco Use  . Smoking status: Never Smoker  . Smokeless tobacco: Never Used  Substance Use Topics  . Alcohol use: Yes    Comment: socially  . Drug use: Never     Family Hx: The patient's family history includes Breast cancer in her mother; Hypertension in her father.  ROS:   Please see the history of present illness.     All other systems reviewed and are negative.   Labs/Other Tests and Data Reviewed:    Recent Labs: 07/18/2018: BUN 12; Creatinine, Ser 0.88; Potassium 4.6; Sodium 141   Recent Lipid Panel Lab Results  Component Value Date/Time   CHOL 230 (H) 09/19/2017 09:00 AM   TRIG 81 09/19/2017 09:00 AM   HDL 54 09/19/2017 09:00 AM   CHOLHDL 4.3 09/19/2017 09:00 AM   LDLCALC 160 (H) 09/19/2017 09:00 AM    Wt Readings from Last 3 Encounters:  04/30/19 275 lb (124.7 kg)  04/24/19 275 lb (124.7 kg)  11/21/18 279 lb 6.4 oz (126.7 kg)     Objective:    Vital Signs:  Ht 5\' 3"  (1.6 m)   Wt 275 lb (124.7 kg)   LMP 07/18/2017   BMI 48.71 kg/m    CONSTITUTIONAL:  Well nourished, well developed female in no acute distress.  EYES: anicteric MOUTH: oral mucosa is pink RESPIRATORY: Normal respiratory effort, symmetric expansion CARDIOVASCULAR: No peripheral edema SKIN: No rash, lesions or ulcers MUSCULOSKELETAL: no digital cyanosis NEURO: Cranial Nerves II-XII  grossly intact, moves all extremities PSYCH: Intact judgement and insight.  A&O x 3, Mood/affect appropriate   ASSESSMENT & PLAN:    1.  Tachycardia -unclear etiology -Albuterol may be contributing to this -I will check a 2D echo to assess LVF especially with fm hx of sister having CHF and DCM -Event monitor to assess for arrhythmias -Check CBC, TSH, CMET  2.  Syncope -sounds like she has vasovagal syncope -this has occurred sporadically since 2012 when she had a workup in 2013 that was normal and she was felt to have vasovagal syncope as episodes only occur when going to the bathroom  -her last episode was in 2015 until she had COVID 19 last month -she refused Tilt table test in 2012 in 2013 -repeat 2D echo -If any recurrent episodes  then refer to EP  3.  Morbid Obesity -Needs to get  into a routine exercise program and cut back on carbs and portions.   COVID-19 Education: The signs and symptoms of COVID-19 were discussed with the patient and how to seek care for testing (follow up with PCP or arrange E-visit).  The importance of social distancing was discussed today.  Patient Risk:   After full review of this patient's clinical status, I feel that they are at least moderate risk at this time.  Time:   Today, I have spent 20 minutes on telemedicine discussing medical problems including tachycardia and reviewing patient's chart including PCP office notes.  Medication Adjustments/Labs and Tests Ordered: Current medicines are reviewed at length with the patient today.  Concerns regarding medicines are outlined above.  Tests Ordered: No orders of the defined types were placed in this encounter.  Medication Changes: No orders of the defined types were placed in this encounter.   Disposition:  Follow up prn  Signed, Armanda Magic, MD  04/30/2019 10:33 AM    [facility]

## 2019-04-30 NOTE — Patient Instructions (Signed)
Medication Instructions:  Your physician recommends that you continue on your current medications as directed. Please refer to the Current Medication list given to you today.  *If you need a refill on your cardiac medications before your next appointment, please call your pharmacy*  Lab Work: CMET, CBC, and TSH If you have labs (blood work) drawn today and your tests are completely normal, you will receive your results only by: Marland Kitchen MyChart Message (if you have MyChart) OR . A paper copy in the mail If you have any lab test that is abnormal or we need to change your treatment, we will call you to review the results.  Testing/Procedures: Your physician has recommended that you wear an event monitor. Event monitors are medical devices that record the heart's electrical activity. Doctors most often [place] these monitors to diagnose arrhythmias. Arrhythmias are problems with the speed or rhythm of the heartbeat. The monitor is a small, portable device. You can wear one while you do your normal daily activities. This is usually used to diagnose what is causing palpitations/syncope (passing out).  Your physician has requested that you have an echocardiogram. Echocardiography is a painless test that uses sound waves to create images of your heart. It provides your doctor with information about the size and shape of your heart and how well your heart's chambers and valves are working. This procedure takes approximately one hour. There are no restrictions for this procedure.  Follow-Up: At [facility], you and your health needs are our priority.  As part of our continuing mission to provide you with exceptional heart care, we have created designated Provider Care Teams.  These Care Teams include your primary Cardiologist (physician) and Advanced Practice Providers (APPs -  Physician Assistants and Nurse Practitioners) who all work together to provide you with the care you need, when you need it.  Follow up  with Dr. Mayford Knife as needed based on results from testing.

## 2019-05-06 ENCOUNTER — Telehealth: Payer: Self-pay | Admitting: Cardiology

## 2019-05-06 NOTE — Telephone Encounter (Signed)
Patient states she is returning call in regards to scheduling an appointment.

## 2019-05-06 NOTE — Telephone Encounter (Signed)
Patient states that she received a call this morning and someone left a voicemail stating they were calling to schedule her for an appointment. Patient has echocardiogram and lab work already scheduled. No further appointments are needed at this time.

## 2019-05-07 ENCOUNTER — Other Ambulatory Visit: Payer: Self-pay

## 2019-05-07 ENCOUNTER — Ambulatory Visit: Admission: RE | Admit: 2019-05-07 | Payer: 59 | Source: Ambulatory Visit

## 2019-05-07 ENCOUNTER — Ambulatory Visit
Admission: RE | Admit: 2019-05-07 | Discharge: 2019-05-07 | Disposition: A | Discharge status: Home / Self Care | Payer: 59 | Source: Ambulatory Visit | Attending: Family Medicine | Admitting: Family Medicine

## 2019-05-07 DIAGNOSIS — Z87898 Personal history of other specified conditions: Secondary | ICD-10-CM

## 2019-05-08 ENCOUNTER — Encounter: Payer: Self-pay | Admitting: Family Medicine

## 2019-05-08 ENCOUNTER — Ambulatory Visit (INDEPENDENT_AMBULATORY_CARE_PROVIDER_SITE_OTHER): Payer: 59

## 2019-05-08 DIAGNOSIS — R55 Syncope and collapse: Secondary | ICD-10-CM | POA: Diagnosis not present

## 2019-05-08 DIAGNOSIS — R000 Tachycardia, unspecified: Secondary | ICD-10-CM

## 2019-05-08 DIAGNOSIS — R002 Palpitations: Secondary | ICD-10-CM

## 2019-05-10 ENCOUNTER — Telehealth (HOSPITAL_COMMUNITY): Payer: Self-pay | Admitting: Cardiology

## 2019-05-10 NOTE — Telephone Encounter (Signed)
Follow up  ° ° °Pt returning call  °

## 2019-05-10 NOTE — Telephone Encounter (Signed)
I called patient and LVM to call the office Regarding a Denial on the echocardiogram scheduled for 05/14/2019. We will have to cancel appointment unless patient would like to be a SELF PAY.  Denial could take 30 more days.. Asked patient to call me.Marland Kitchen [facility]

## 2019-05-14 ENCOUNTER — Other Ambulatory Visit: Payer: 59

## 2019-05-14 ENCOUNTER — Other Ambulatory Visit (HOSPITAL_COMMUNITY): Payer: 59

## 2019-05-15 ENCOUNTER — Telehealth: Payer: Self-pay | Admitting: Cardiology

## 2019-05-15 NOTE — Telephone Encounter (Signed)
Patient states that her monitor has caused redness in the area on her skin where the strips are attached. Please advise.

## 2019-05-16 NOTE — Telephone Encounter (Signed)
Patient called [employer].  They will ship a bridge and sensitive skin electrodes to the patient overnight.  Patient had been instructed to call them once she receives her package.  They will talk her through applying and restarting her monitor.  Her enrollment period will be extended to accommodate the time she was not able to wear the monitor.

## 2019-05-17 ENCOUNTER — Other Ambulatory Visit: Payer: Self-pay | Admitting: Family Medicine

## 2019-05-17 DIAGNOSIS — N63 Unspecified lump in unspecified breast: Secondary | ICD-10-CM

## 2019-06-04 ENCOUNTER — Other Ambulatory Visit: Payer: Self-pay | Admitting: Family Medicine

## 2019-06-04 ENCOUNTER — Other Ambulatory Visit: Payer: Self-pay

## 2019-06-04 ENCOUNTER — Ambulatory Visit
Admission: RE | Admit: 2019-06-04 | Discharge: 2019-06-04 | Disposition: A | Discharge status: Home / Self Care | Payer: 59 | Source: Ambulatory Visit | Attending: Family Medicine | Admitting: Family Medicine

## 2019-06-04 DIAGNOSIS — K219 Gastro-esophageal reflux disease without esophagitis: Secondary | ICD-10-CM

## 2019-06-04 DIAGNOSIS — N63 Unspecified lump in unspecified breast: Secondary | ICD-10-CM

## 2019-06-10 ENCOUNTER — Encounter: Payer: Self-pay | Admitting: Family

## 2019-06-10 ENCOUNTER — Ambulatory Visit: Payer: 59 | Attending: Family | Admitting: Family

## 2019-06-10 ENCOUNTER — Other Ambulatory Visit: Payer: Self-pay

## 2019-06-10 VITALS — BP 128/83 | HR 67 | Temp 97.5°F | Resp 16 | Ht 64.5 in | Wt 273.6 lb

## 2019-06-10 DIAGNOSIS — J4520 Mild intermittent asthma, uncomplicated: Secondary | ICD-10-CM

## 2019-06-10 DIAGNOSIS — K219 Gastro-esophageal reflux disease without esophagitis: Secondary | ICD-10-CM | POA: Diagnosis not present

## 2019-06-10 MED ORDER — OMEPRAZOLE 40 MG PO CPDR: 40.0000 mg | DELAYED_RELEASE_CAPSULE | Freq: Every day | ORAL | 2 refills | Status: DC

## 2019-06-10 MED ORDER — ALBUTEROL SULFATE HFA 108 (90 BASE) MCG/ACT IN AERS: 2.0000 | INHALATION_SPRAY | Freq: Four times a day (QID) | RESPIRATORY_TRACT | 99 refills | Status: DC | PRN

## 2019-06-10 MED ORDER — ALBUTEROL SULFATE (2.5 MG/3ML) 0.083% IN NEBU: 2.5000 mg | INHALATION_SOLUTION | Freq: Four times a day (QID) | RESPIRATORY_TRACT | 0 refills | Status: AC | PRN

## 2019-06-10 NOTE — Patient Instructions (Addendum)
Continue albuterol inhaler and omeprazole. Albuterol nebulizer added as needed. Asthma, Adult  Asthma is a long-term (chronic) condition in which the airways get tight and narrow. The airways are the breathing passages that lead from the nose and mouth down into the lungs. A person with asthma will have times when symptoms get worse. These are called asthma attacks. They can cause coughing, whistling sounds when you breathe (wheezing), shortness of breath, and chest pain. They can make it hard to breathe. There is no cure for asthma, but medicines and lifestyle changes can help control it. There are many things that can bring on an asthma attack or make asthma symptoms worse (triggers). Common triggers include:  Mold.  Dust.  Cigarette smoke.  Cockroaches.  Things that can cause allergy symptoms (allergens). These include animal skin flakes (dander) and pollen from trees or grass.  Things that pollute the air. These may include household cleaners, wood smoke, smog, or chemical odors.  Cold air, weather changes, and wind.  Crying or laughing hard.  Stress.  Certain medicines or drugs.  Certain foods such as dried fruit, potato chips, and grape juice.  Infections, such as a cold or the flu.  Certain medical conditions or diseases.  Exercise or tiring activities. Asthma may be treated with medicines and by staying away from the things that cause asthma attacks. Types of medicines may include:  Controller medicines. These help prevent asthma symptoms. They are usually taken every day.  Fast-acting reliever or rescue medicines. These quickly relieve asthma symptoms. They are used as needed and provide short-term relief.  Allergy medicines if your attacks are brought on by allergens.  Medicines to help control the body's defense (immune) system. Follow these instructions at home: Avoiding triggers in your home  Change your heating and air conditioning filter often.  Limit  your use of fireplaces and wood stoves.  Get rid of pests (such as roaches and mice) and their droppings.  Throw away plants if you see mold on them.  Clean your floors. Dust regularly. Use cleaning products that do not smell.  Have someone vacuum when you are not home. Use a vacuum cleaner with a HEPA filter if possible.  Replace carpet with wood, tile, or vinyl flooring. Carpet can trap animal skin flakes and dust.  Use allergy-proof pillows, mattress covers, and box spring covers.  Wash bed sheets and blankets every week in hot water. Dry them in a dryer.  Keep your bedroom free of any triggers.  Avoid pets and keep windows closed when things that cause allergy symptoms are in the air.  Use blankets that are made of polyester or cotton.  Clean bathrooms and kitchens with bleach. If possible, have someone repaint the walls in these rooms with mold-resistant paint. Keep out of the rooms that are being cleaned and painted.  Wash your hands often with soap and water. If soap and water are not available, use hand sanitizer.  Do not allow anyone to smoke in your home. General instructions  Take over-the-counter and prescription medicines only as told by your doctor. ? Talk with your doctor if you have questions about how or when to take your medicines. ? Make note if you need to use your medicines more often than usual.  Do not use any products that contain nicotine or tobacco, such as cigarettes and e-cigarettes. If you need help quitting, ask your doctor.  Stay away from secondhand smoke.  Avoid doing things outdoors when allergen counts are high and  when air quality is low.  Wear a ski mask when doing outdoor activities in the winter. The mask should cover your nose and mouth. Exercise indoors on cold days if you can.  Warm up before you exercise. Take time to cool down after exercise.  Use a peak flow meter as told by your doctor. A peak flow meter is a tool that measures  how well the lungs are working.  Keep track of the peak flow meter's readings. Write them down.  Follow your asthma action plan. This is a written plan for taking care of your asthma and treating your attacks.  Make sure you get all the shots (vaccines) that your doctor recommends. Ask your doctor about a flu shot and a pneumonia shot.  Keep all follow-up visits as told by your doctor. This is important. Contact a doctor if:  You have wheezing, shortness of breath, or a cough even while taking medicine to prevent attacks.  The mucus you cough up (sputum) is thicker than usual.  The mucus you cough up changes from clear or white to yellow, green, gray, or bloody.  You have problems from the medicine you are taking, such as: ? A rash. ? Itching. ? Swelling. ? Trouble breathing.  You need reliever medicines more than 2-3 times a week.  Your peak flow reading is still at 50-79% of your personal best after following the action plan for 1 hour.  You have a fever. Get help right away if:  You seem to be worse and are not responding to medicine during an asthma attack.  You are short of breath even at rest.  You get short of breath when doing very little activity.  You have trouble eating, drinking, or talking.  You have chest pain or tightness.  You have a fast heartbeat.  Your lips or fingernails start to turn blue.  You are light-headed or dizzy, or you faint.  Your peak flow is less than 50% of your personal best.  You feel too tired to breathe normally. Summary  Asthma is a long-term (chronic) condition in which the airways get tight and narrow. An asthma attack can make it hard to breathe.  Asthma cannot be cured, but medicines and lifestyle changes can help control it.  Make sure you understand how to avoid triggers and how and when to use your medicines. This information is not intended to replace advice given to you by your health care provider. Make sure you  discuss any questions you have with your health care provider. Document Revised: 04/26/2018 Document Reviewed: 03/28/2016 Elsevier Patient Education  2020 [employer].

## 2019-06-10 NOTE — Progress Notes (Signed)
Patient ID: Keyleigh Manninen, female    DOB: 04-Mar-1966  MRN: [id number]  CC: Medication refill   Subjective: Mandi Mattioli is a 54 y.o. female who with history of migraine without status migrainosus, not intractable, GERD, sleep disturbance, and asthma who presents for medication refill.  1. ASTHMA FOLLOW-UP: Currently taking: see med list Taking albuterol MDI? Yes. Last taken 2 weeks ago. Taking nebulized albuterol? No hasn't had to use recently. Recently purchase a new nebulizer machine just in case she needs it. Taking controller medication? Denies Frequency of rescue medication use: Maybe once per week and sometimes less. Med Adherence: Yes Recent wheezing? Denies Shortness of breath? Denies Cough? Yes  Recent exacerbations/ER visits/hospitalizations: Denies Asthma Triggers: aerosol sprays such as oven cleaner, strong smells such as gasoline fumes Activity limitations? Not more than usual  Asthma stage: Mild intermittent asthma without complication Comments: Last visit April 18, 2019 by Dr. Jillyn Hidden. During that encounter patient reported continued cough status post COVID-19 infection for which she tested positive on March 11, 2019. Patient also has a history of cough variant asthma. Patient instructed to continue albuterol inhaler as needed and a prescription provided for prednisone taper to assist with possible asthma exacerbation.  Referral was made to pulmonology for further evaluation of cough variant asthma.  Reports April 24, 2019 she went to the [facility] respiratory clinic for the pulmonology referral where an x-ray of the chest was completed and determined that she has borderline cardiomegaly. States no further advice or treatment was given related to that particular referral.  Last visit April 24, 2019 by nurse practitioner Joaquin Courts. During that encounter patient presented with shortness of breath and in stable condition, moderate asthma with  exacerbation, and cough. Patient was instructed to continue albuterol inhaler as needed. A referral to cardiology was placed to rule out cardiac involvement as a source of persistent cough, Promethazine-DM prescribed for as needed use and chest x-ray pending to rule out pneumonia.   Last visit April 30, 2019 by cardiologist Dr. Mayford Knife. During that encounter it was determined tachycardia was related to unknown cause and that albuterol may have been contributing to this. A 2D echo ordered to assess for LVF considering family history of sister having CHF and DCM, an event monitor to assess for arrhythmias, and check for CBC, TSH, and CMET. Syncope was determined to be related to vasovagal syncope which is her history since 2012 with episodes only occurring when she went to the bathroom. Recommendation to refer to EP if any recurrent episodes of syncope occur. Patient reports during today's visit that she was unable to do the event monitor for arrhythmia assessment as the electrodes broke her skin out and was very uncomfortable to tolerate.  2. GERD FOLLOW-UP:  Paitent complains of heartburn. This has been associated with difficulty swallowing, regurgitation of undigested food and "Sometimes when I don't take the medication it feels like chunks of food are sitting in my throat.".  She denies abdominal bloating, belching, belching and eructation, bilious reflux, chest pain, deep pressure at base of neck, early satiety, hematemesis, hoarseness, laryngitis, melena, midespigastric pain, nausea, need to clear throat frequently, nocturnal burning, unexpected weight loss and upper abdominal discomfort. Symptoms have been present for 25 years. She has had dysphagia for both solids and liquids.  She has not lost weight. She denies melena, hematochezia, hematemesis, and coffee ground emesis. Medical therapy in the past has included proton pump inhibitors.   Patient Active Problem List   Diagnosis Date Noted  .  Migraine without status migrainosus, not intractable 05/17/2018  . Gastroesophageal reflux disease without esophagitis 05/17/2018  . Sleep disturbance 05/17/2018     Current Outpatient Medications on File Prior to Visit  Medication Sig Dispense Refill  . albuterol (VENTOLIN HFA) 108 (90 Base) MCG/ACT inhaler Inhale 2 puffs into the lungs every 6 (six) hours as needed for wheezing or shortness of breath. 18 g prn  . gabapentin (NEURONTIN) 100 MG capsule Take 1 capsule (100 mg total) by mouth 2 (two) times daily. 60 capsule 5  . omeprazole (PRILOSEC) 40 MG capsule Take 1 capsule by mouth once daily 30 capsule 0  . promethazine-dextromethorphan (PROMETHAZINE-DM) 6.25-15 MG/5ML syrup Take 5 mLs by mouth 4 (four) times daily as needed for cough. 140 mL 0  . zonisamide (ZONEGRAN) 25 MG capsule Take 1 capsule (25 mg total) by mouth daily. 30 capsule 5   No current facility-administered medications on file prior to visit.    Allergies  Allergen Reactions  . Sulfa Antibiotics Hives    Social History   Socioeconomic History  . Marital status: Widowed    Spouse name: Not on file  . Number of children: Not on file  . Years of education: Not on file  . Highest education level: Not on file  Occupational History  . Not on file  Tobacco Use  . Smoking status: Never Smoker  . Smokeless tobacco: Never Used  Substance and Sexual Activity  . Alcohol use: Yes    Comment: socially  . Drug use: Never  . Sexual activity: Not Currently  Other Topics Concern  . Not on file  Social History Narrative  . Not on file   Social Determinants of Health   Financial Resource Strain:   . Difficulty of Paying Living Expenses:   Food Insecurity:   . Worried About Programme researcher, broadcasting/film/video in the Last Year:   . Barista in the Last Year:   Transportation Needs:   . Freight forwarder (Medical):   Marland Kitchen Lack of Transportation (Non-Medical):   Physical Activity:   . Days of Exercise per Week:   .  Minutes of Exercise per Session:   Stress:   . Feeling of Stress :   Social Connections:   . Frequency of Communication with Friends and Family:   . Frequency of Social Gatherings with Friends and Family:   . Attends Religious Services:   . Active Member of Clubs or Organizations:   . Attends Banker Meetings:   Marland Kitchen Marital Status:   Intimate Partner Violence:   . Fear of Current or Ex-Partner:   . Emotionally Abused:   Marland Kitchen Physically Abused:   . Sexually Abused:     Family History  Problem Relation Age of Onset  . Breast cancer Mother 64  . Hypertension Father     Past Surgical History:  Procedure Laterality Date  . ECTOPIC PREGNANCY SURGERY    . Right breast biopsy      ROS: Review of Systems Negative except as stated above  PHYSICAL EXAM: LMP 07/18/2017   Vitals with BMI 06/10/2019 04/30/2019 04/24/2019  Height 5' 4.5" 5\' 3"  5\' 3"   Weight 273 lbs 10 oz 275 lbs 275 lbs  BMI 46.25 48.73 48.73  Systolic 128 - 138  Diastolic 83 - 74  Pulse 67 - 68  SpO2- 100%, room air Temperature- 97.5 F  Physical Exam General appearance - alert, well appearing, and in no distress and oriented to person, place, and time  Mental status - alert, oriented to person, place, and time, normal mood, behavior, speech, dress, motor activity, and thought processes Chest - clear to auscultation, no wheezes, rales or rhonchi, symmetric air entry, no tachypnea, retractions or cyanosis Heart - normal rate, regular rhythm, normal S1, S2, no murmurs, rubs, clicks or gallops Abdomen - soft, nontender, nondistended, no masses or organomegaly Neurological - alert, oriented, normal speech, no focal findings or movement disorder noted, neck supple without rigidity, cranial nerves II through XII intact, funduscopic exam normal, discs flat and sharp, DTR's normal and symmetric, motor and sensory grossly normal bilaterally, normal muscle tone, no tremors, strength 5/5, Romberg sign negative, normal  gait and station Musculoskeletal - no joint tenderness, deformity or swelling, no muscular tenderness noted, full range of motion without pain  CMP Latest Ref Rng & Units 07/18/2018 09/19/2017  Glucose 65 - 99 mg/dL 952(W) 413(K)  BUN 6 - 24 mg/dL 12 11  Creatinine 4.40 - 1.00 mg/dL 1.02 7.25  Sodium 366 - 144 mmol/L 141 141  Potassium 3.5 - 5.2 mmol/L 4.6 4.4  Chloride 96 - 106 mmol/L 105 104  CO2 20 - 29 mmol/L 22 22  Calcium 8.7 - 10.2 mg/dL 9.5 9.4  Total Protein 6.0 - 8.5 g/dL - 7.3  Total Bilirubin 0.0 - 1.2 mg/dL - 0.4  Alkaline Phos 39 - 117 IU/L - 79  AST 0 - 40 IU/L - 12  ALT 0 - 32 IU/L - 10   Lipid Panel     Component Value Date/Time   CHOL 230 (H) 09/19/2017 0900   TRIG 81 09/19/2017 0900   HDL 54 09/19/2017 0900   CHOLHDL 4.3 09/19/2017 0900   LDLCALC 160 (H) 09/19/2017 0900    CBC    Component Value Date/Time   WBC 4.4 09/19/2017 0900   RBC 4.00 09/19/2017 0900   HGB 11.6 09/19/2017 0900   HCT 36.2 09/19/2017 0900   PLT 292 09/19/2017 0900   MCV 91 09/19/2017 0900   MCH 29.0 09/19/2017 0900   MCHC 32.0 09/19/2017 0900   RDW 13.6 09/19/2017 0900   LYMPHSABS 1.3 09/19/2017 0900   EOSABS 0.2 09/19/2017 0900   BASOSABS 0.0 09/19/2017 0900    ASSESSMENT AND PLAN: 1. Mild intermittent asthma without complication: -Asthma is well-controlled on current medications.  -Continue medications as prescribed. - albuterol (PROVENTIL) (2.5 MG/3ML) 0.083% nebulizer solution; Take 3 mLs (2.5 mg total) by nebulization every 6 (six) hours as needed for wheezing or shortness of breath.  Dispense: 150 mL; Refill: 0 - albuterol (VENTOLIN HFA) 108 (90 Base) MCG/ACT inhaler; Inhale 2 puffs into the lungs every 6 (six) hours as needed for wheezing or shortness of breath.  Dispense: 18 g; Refill: prn -Follow-up with primary doctor in 3 months of sooner if needed. -Patient was given clear instructions to go to Emergency Department or return to medical center if symptoms don't  improve, worsen, or new problems develop.The patient verbalized understanding.  2. Gastroesophageal reflux disease without esophagitis: -GERD currently well-managed on current medication. Continue use as prescribed. - omeprazole (PRILOSEC) 40 MG capsule; Take 1 capsule (40 mg total) by mouth daily.  Dispense: 30 capsule; Refill: 2 -Patient was given clear instructions to go to Emergency Department or return to medical center if symptoms don't improve, worsen, or new problems develop.The patient verbalized understanding.  Patient was given the opportunity to ask questions.  Patient verbalized understanding of the plan and was able to repeat key elements of the plan.    Requested  Prescriptions    No prescriptions requested or ordered in this encounter    Mckinzey Entwistle Zachery Dauer, NP

## 2019-06-24 ENCOUNTER — Telehealth: Payer: Self-pay

## 2019-06-24 DIAGNOSIS — R002 Palpitations: Secondary | ICD-10-CM

## 2019-06-24 DIAGNOSIS — R55 Syncope and collapse: Secondary | ICD-10-CM

## 2019-06-24 DIAGNOSIS — R0602 Shortness of breath: Secondary | ICD-10-CM

## 2019-06-24 NOTE — Telephone Encounter (Signed)
The patient has been notified of the result and verbalized understanding.  All questions (if any) were answered. Theresia Majors, RN 06/24/2019 4:41 PM

## 2019-06-24 NOTE — Telephone Encounter (Signed)
-----   Message from Quintella Reichert, MD sent at 06/20/2019  6:33 PM EDT ----- No arrhythmias on event monitor but appeared to have transient St changes.  Please have her come in for baseline 12 lead EKG and get a 2D echo to assess LVF and a stress myoview.

## 2019-06-27 ENCOUNTER — Ambulatory Visit (HOSPITAL_COMMUNITY)
Admission: EM | Admit: 2019-06-27 | Discharge: 2019-06-27 | Disposition: A | Discharge status: Home / Self Care | Payer: 59 | Attending: Family Medicine | Admitting: Family Medicine

## 2019-06-27 ENCOUNTER — Encounter (HOSPITAL_COMMUNITY): Payer: Self-pay

## 2019-06-27 ENCOUNTER — Other Ambulatory Visit: Payer: Self-pay

## 2019-06-27 DIAGNOSIS — N760 Acute vaginitis: Secondary | ICD-10-CM | POA: Diagnosis present

## 2019-06-27 HISTORY — DX: Cardiomegaly: I51.7

## 2019-06-27 MED ORDER — METRONIDAZOLE 500 MG PO TABS: 500.0000 mg | ORAL_TABLET | Freq: Two times a day (BID) | ORAL | 0 refills | Status: DC

## 2019-06-27 MED ORDER — FLUCONAZOLE 150 MG PO TABS: 150.0000 mg | ORAL_TABLET | Freq: Every day | ORAL | 0 refills | Status: DC

## 2019-06-27 NOTE — ED Provider Notes (Signed)
[place]    CSN: [id number] Arrival date & time: 06/27/19  1519      History   Chief Complaint Chief Complaint  Patient presents with   vaginal irritation    HPI Tiffany Jones is a 54 y.o. female.   HPI  Patient states that she is perimenopausal with irregular menses and hot flashes She currently has vaginal discharge for about a week.  She has an odor that she associates with BV.  Also some itching.  She would like to be treated for acute vaginitis She states she would also like to be tested for STD.  She has had unprotected sexual relations. She declines testing for syphilis and HIV She denies fever chills, abdominal pain, dysuria  Past Medical History:  Diagnosis Date   Asthma    Enlarged heart    Gastroesophageal reflux disease without esophagitis 05/17/2018   GERD (gastroesophageal reflux disease)    History of abnormal mammogram    Migraine without status migrainosus, not intractable 05/17/2018   Migraines    Sleep disturbance 05/17/2018    Patient Active Problem List   Diagnosis Date Noted   Migraine without status migrainosus, not intractable 05/17/2018   Gastroesophageal reflux disease without esophagitis 05/17/2018   Sleep disturbance 05/17/2018    Past Surgical History:  Procedure Laterality Date   ECTOPIC PREGNANCY SURGERY     Right breast biopsy      OB History   No obstetric history on file.      Home Medications    Prior to Admission medications   Medication Sig Start Date End Date Taking? Authorizing Provider  albuterol (PROVENTIL) (2.5 MG/3ML) 0.083% nebulizer solution Take 3 mLs (2.5 mg total) by nebulization every 6 (six) hours as needed for wheezing or shortness of breath. 06/10/19   Camillia Herter, NP  albuterol (VENTOLIN HFA) 108 (90 Base) MCG/ACT inhaler Inhale 2 puffs into the lungs every 6 (six) hours as needed for wheezing or shortness of breath. 06/10/19   Camillia Herter, NP  fluconazole (DIFLUCAN)  150 MG tablet Take 1 tablet (150 mg total) by mouth daily. Repeat in 1 week if needed 06/27/19   Raylene Everts, MD  gabapentin (NEURONTIN) 100 MG capsule Take 1 capsule (100 mg total) by mouth 2 (two) times daily. 02/15/19   Fulp, Cammie, MD  metroNIDAZOLE (FLAGYL) 500 MG tablet Take 1 tablet (500 mg total) by mouth 2 (two) times daily. 06/27/19   Raylene Everts, MD  omeprazole (PRILOSEC) 40 MG capsule Take 1 capsule (40 mg total) by mouth daily. 06/10/19   Camillia Herter, NP  zonisamide (ZONEGRAN) 25 MG capsule Take 1 capsule (25 mg total) by mouth daily. 02/15/19   Antony Blackbird, MD    Family History Family History  Problem Relation Age of Onset   Breast cancer Mother 55   Hypertension Father     Social History Social History   Tobacco Use   Smoking status: Never Smoker   Smokeless tobacco: Never Used  Substance Use Topics   Alcohol use: Yes    Comment: socially   Drug use: Never     Allergies   Sulfa antibiotics   Review of Systems Review of Systems  Genitourinary: Positive for vaginal discharge.     Physical Exam Triage Vital Signs ED Triage Vitals [06/27/19 1617]  Enc Vitals Group     BP 116/73     Pulse Rate 63     Resp 16     Temp 98.3  F (36.8 C)     Temp Source Oral     SpO2 100 %     Weight 271 lb (122.9 kg)     Height 5\' 4"  (1.626 m)     Head Circumference      Peak Flow      Pain Score 0     Pain Loc      Pain Edu?      Excl. in GC?    No data found.  Updated Vital Signs BP 116/73    Pulse 63    Temp 98.3 F (36.8 C) (Oral)    Resp 16    Ht 5\' 4"  (1.626 m)    Wt 122.9 kg    LMP 07/18/2017    SpO2 100%    BMI 46.52 kg/m     Physical Exam Constitutional:      General: She is not in acute distress.    Appearance: She is well-developed. She is obese.  HENT:     Head: Normocephalic and atraumatic.  Eyes:     Conjunctiva/sclera: Conjunctivae normal.     Pupils: Pupils are equal, round, and reactive to light.  Cardiovascular:      Rate and Rhythm: Normal rate.  Pulmonary:     Effort: Pulmonary effort is normal. No respiratory distress.  Genitourinary:    Comments: Reviewed proper technique for self swab Musculoskeletal:        General: Normal range of motion.     Cervical back: Normal range of motion.  Skin:    General: Skin is warm and dry.  Neurological:     General: No focal deficit present.     Mental Status: She is alert.  Psychiatric:        Mood and Affect: Mood normal.        Behavior: Behavior normal.      UC Treatments / Results  Labs (all labs ordered are listed, but only abnormal results are displayed) Labs Reviewed  CERVICOVAGINAL ANCILLARY ONLY    EKG   Radiology No results found.  Procedures Procedures (including critical care time)  Medications Ordered in UC Medications - No data to display  Initial Impression / Assessment and Plan / UC Course  I have reviewed the triage vital signs and the nursing notes.  Pertinent labs & imaging results that were available during my care of the patient were reviewed by me and considered in my medical decision making (see chart for details).     Reviewed benefits of safe sex We will treat for vaginitis and test for other STDs Patient declined blood work Final Clinical Impressions(s) / UC Diagnoses   Final diagnoses:  Acute vaginitis     Discharge Instructions     Take the medication as directed Check your results on My Chart Any positive results will be called to you   ED Prescriptions    Medication Sig Dispense Auth. Provider   metroNIDAZOLE (FLAGYL) 500 MG tablet Take 1 tablet (500 mg total) by mouth 2 (two) times daily. 14 tablet , MD   fluconazole (DIFLUCAN) 150 MG tablet Take 1 tablet (150 mg total) by mouth daily. Repeat in 1 week if needed 2 tablet 07/20/2017, MD     PDMP not reviewed this encounter.   Eustace Moore, MD 06/27/19 2046

## 2019-06-27 NOTE — ED Triage Notes (Signed)
Pt c/o vaginal itching, yellow vaginal discharge with an odorx1 wk.

## 2019-06-27 NOTE — Discharge Instructions (Addendum)
Take the medication as directed Check your results on My Chart Any positive results will be called to you

## 2019-06-28 ENCOUNTER — Other Ambulatory Visit: Payer: Self-pay | Admitting: Surgery

## 2019-06-28 ENCOUNTER — Telehealth: Payer: Self-pay | Admitting: *Deleted

## 2019-06-28 DIAGNOSIS — N6021 Fibroadenosis of right breast: Secondary | ICD-10-CM

## 2019-06-28 LAB — CERVICOVAGINAL ANCILLARY ONLY
Chlamydia: NEGATIVE
Comment: NEGATIVE
Comment: NEGATIVE
Comment: NORMAL
Neisseria Gonorrhea: NEGATIVE
Trichomonas: POSITIVE — AB

## 2019-06-28 NOTE — Telephone Encounter (Signed)
I will send a message to scheduling team to please reach out to the pt with an appt with Dr. Mayford Knife or APP for pre op clearance as well as further cardiac testing, per Pre Op Provider, Dr. Molinda Bailiff notes indicate pt will need EKG, stress test and echo.

## 2019-06-28 NOTE — Telephone Encounter (Signed)
Primary Cardiologist:Traci Turner, MD  Chart reviewed as part of pre-operative protocol coverage. Because of Meshawn Schmoker's past medical history and time since last visit, he/she will require a follow-up visit in order to better assess preoperative cardiovascular risk.  Pre-op covering staff: - Please schedule appointment and call patient to inform them. - Please contact requesting surgeon's office via preferred method (i.e, phone, fax) to inform them of need for appointment prior to surgery.  If applicable, this message will also be routed to pharmacy pool and/or primary cardiologist for input on holding anticoagulant/antiplatelet agent as requested below so that this information is available at time of patient's appointment.   Ronney Asters, NP  06/28/2019, 3:38 PM

## 2019-06-28 NOTE — Telephone Encounter (Signed)
Patient needs office visit.  Dr. Mayford Knife indicated after the patient's cardiac event monitor that the patient would need an EKG, echocardiogram, and stress test.

## 2019-06-28 NOTE — Telephone Encounter (Signed)
   [facility] Pre-operative Risk Assessment    Request for surgical clearance:  1. What type of surgery is being performed? RADIOACTIVE SEED GUIDED/RIGHT BREAST LUMPECTOMY   2. When is this surgery scheduled? TBD   3. What type of clearance is required (medical clearance vs. Pharmacy clearance to hold med vs. Both)? MEDICAL  4. Are there any medications that need to be held prior to surgery and how long? NONE LISTED   5. Practice name and name of physician performing surgery? [facility]; [place]    6. What is your office phone number [phone]    7.   What is your office fax number [phone] ATTN: ARMEN FERGUSON,RMA  8.   Anesthesia type (None, local, MAC, general) ? GENERAL   Julaine Hua 06/28/2019, 2:50 PM  _________________________________________________________________   (provider comments below)

## 2019-07-02 NOTE — Telephone Encounter (Signed)
See new notes from Dr. Mayford Knife. I will send to pre op pool.

## 2019-07-02 NOTE — Telephone Encounter (Signed)
Pt is scheduled to see Dr. Mayford Knife 07/04/19 for pre op clearance. Per Dr. Mayford Knife pt will need echo, stress test and EKG. Pt is currently scheduled for a nurse visit for and EKG for 07/19/19. I will cancel the nurse visit 07/19/19 for ekg as EKG can be done on in person appt 07/04/19 with Dr. Mayford Knife. I have informed Dr. Norris Cross nurse Wenda Low as Lorain Childes. I will send clearance notes to MD for upcoming appt as well as to [place], RN for TT. I did not cancel the echo that has been scheduled.

## 2019-07-02 NOTE — Telephone Encounter (Signed)
Patient does not need to see me for OV.  She just needs to have a 12 lead EKG done at time of her stress test and an echo.  If these are normal then she is cleared.  She does not need to see me back unless they are abnormal

## 2019-07-03 NOTE — Telephone Encounter (Signed)
Thank you! Once this is scheduled can d/c nurse visit EKG appt since Dr. Mayford Knife said the EKG can just be obtained at time of stress. Thanks.

## 2019-07-03 NOTE — Telephone Encounter (Signed)
   Primary Cardiologist: Armanda Magic, MD  Chart reviewed as part of pre-operative protocol coverage. Previous notes reviewed. Per Dr. Mayford Knife, patient needs an EKG at the time of her stress test as well as an echocardiogram. I see she has an EKG scheduled at a nurse visit and echo on 07/19/19. Dr. Mayford Knife specified that her EKG could just be done at the time of her stress test. I do not see the stress test is scheduled. Did appear to be listed under orders. Can you help look into this? Pre-op team will follow peripherally until studies are back.  Laurann Montana, PA-C 07/03/2019, 10:46 AM

## 2019-07-03 NOTE — Telephone Encounter (Signed)
I s/w Wannetta Sender RN nurse for Dr. Mayford Knife who states the stress test is still pending insurance approval.

## 2019-07-04 ENCOUNTER — Ambulatory Visit: Payer: 59 | Admitting: Cardiology

## 2019-07-15 NOTE — Telephone Encounter (Signed)
   Primary Cardiologist: Armanda Magic, MD  Chart revisited as part of pre-operative protocol coverage.   Callback team can you find out what status of stress test is? Per last update 2 weeks ago, was awaiting insurance coverage. Need to get this scheduled so that decision can be made on clearance. Please let surgeon know these things are pending for clearance.   Laurann Montana, PA-C 07/15/2019, 8:50 AM

## 2019-07-17 ENCOUNTER — Encounter: Payer: Self-pay | Admitting: Critical Care Medicine

## 2019-07-17 ENCOUNTER — Ambulatory Visit (INDEPENDENT_AMBULATORY_CARE_PROVIDER_SITE_OTHER): Payer: 59 | Admitting: Critical Care Medicine

## 2019-07-17 ENCOUNTER — Other Ambulatory Visit: Payer: Self-pay

## 2019-07-17 VITALS — BP 112/66 | HR 70 | Temp 97.2°F | Ht 64.0 in | Wt 72.0 lb

## 2019-07-17 DIAGNOSIS — J309 Allergic rhinitis, unspecified: Secondary | ICD-10-CM

## 2019-07-17 DIAGNOSIS — J4530 Mild persistent asthma, uncomplicated: Secondary | ICD-10-CM | POA: Diagnosis not present

## 2019-07-17 DIAGNOSIS — K219 Gastro-esophageal reflux disease without esophagitis: Secondary | ICD-10-CM | POA: Diagnosis not present

## 2019-07-17 MED ORDER — FLOVENT HFA 110 MCG/ACT IN AERO
2.0000 | INHALATION_SPRAY | Freq: Two times a day (BID) | RESPIRATORY_TRACT | 11 refills | Status: DC
Start: 2019-07-17 — End: 2020-06-04

## 2019-07-17 NOTE — Progress Notes (Signed)
Synopsis: Referred in May 2021 for asthma by Cain Saupe, MD.  Subjective:   PATIENT ID: Tiffany Jones GENDER: female DOB: [birth year], MRN: [id number]  Chief Complaint  Patient presents with  . Consult    hx of Asthma, had Covid Jan 2021, "nagging" mostly dry cough     Tiffany Jones is a 55 year old woman with a history of asthma diagnosed around the age 74 after an episode of pneumonia who presents to establish care.  Her asthma has been worse since having Covid in January 2021.  She had a persistent cough for several months after having Covid which is finally started to improve.  She was using albuterol 2-3 times per day for several weeks after having Covid.  Her symptoms are improved but she is not back to her baseline.  She continues to have phlegm that causes shortness of breath.  She has never been on maintenance inhalers.  She has 2 sons with asthma, but there is no other family history of lung disease.  She has never been hospitalized for asthma, but had an urgent care visit recently where she was given prednisone for her severe coughing, which improved her symptoms transiently.  Since being off prednisone she has not needed her rescue inhaler, but she has began coughing again slightly.  She denies wheezing and nighttime symptoms.  Warmer weather in general makes her symptoms worse.  She has no previous history of smoking or vaping.  She is chronic sinus allergies with congestion, sneezing, watery eyes, and coughing, but these have not been bad recently.  She takes Claritin as needed for allergies.  She has GERD with breakthrough symptoms about 2 days/week.  She takes Prilosec daily.  Her symptoms are triggered by eating citrus, spicy foods.  She drinks coffee but seldom drinks soda.  She does eat a lot of mints.  ACT 18     Past Medical History:  Diagnosis Date  . Asthma   . Enlarged heart   . Gastroesophageal reflux disease without esophagitis 05/17/2018  . GERD  (gastroesophageal reflux disease)   . History of abnormal mammogram   . Migraine without status migrainosus, not intractable 05/17/2018  . Migraines   . Sleep disturbance 05/17/2018     Family History  Problem Relation Age of Onset  . Breast cancer Mother 17  . Hypertension Father   . Alcohol abuse Father   . Lymphoma Maternal Grandfather   . Asthma Son      Past Surgical History:  Procedure Laterality Date  . ECTOPIC PREGNANCY SURGERY    . Right breast biopsy      Social History   Socioeconomic History  . Marital status: Widowed    Spouse name: Not on file  . Number of children: Not on file  . Years of education: Not on file  . Highest education level: Not on file  Occupational History  . Not on file  Tobacco Use  . Smoking status: Never Smoker  . Smokeless tobacco: Never Used  Substance and Sexual Activity  . Alcohol use: Yes    Comment: socially  . Drug use: Never  . Sexual activity: Not Currently  Other Topics Concern  . Not on file  Social History Narrative  . Not on file   Social Determinants of Health   Financial Resource Strain:   . Difficulty of Paying Living Expenses:   Food Insecurity:   . Worried About Programme researcher, broadcasting/film/video in the Last Year:   . [employer] of  Food in the Last Year:   Transportation Needs:   . Film/video editor (Medical):   Marland Kitchen Lack of Transportation (Non-Medical):   Physical Activity:   . Days of Exercise per Week:   . Minutes of Exercise per Session:   Stress:   . Feeling of Stress :   Social Connections:   . Frequency of Communication with Friends and Family:   . Frequency of Social Gatherings with Friends and Family:   . Attends Religious Services:   . Active Member of Clubs or Organizations:   . Attends Archivist Meetings:   Marland Kitchen Marital Status:   Intimate Partner Violence:   . Fear of Current or Ex-Partner:   . Emotionally Abused:   Marland Kitchen Physically Abused:   . Sexually Abused:      Allergies  Allergen  Reactions  . Sulfa Antibiotics Hives     Immunization History  Administered Date(s) Administered  . Influenza,inj,Quad PF,6+ Mos 11/21/2018  . Moderna SARS-COVID-2 Vaccination 07/16/2019  . Tdap 11/21/2018    Outpatient Medications Prior to Visit  Medication Sig Dispense Refill  . albuterol (PROVENTIL) (2.5 MG/3ML) 0.083% nebulizer solution Take 3 mLs (2.5 mg total) by nebulization every 6 (six) hours as needed for wheezing or shortness of breath. 150 mL 0  . albuterol (VENTOLIN HFA) 108 (90 Base) MCG/ACT inhaler Inhale 2 puffs into the lungs every 6 (six) hours as needed for wheezing or shortness of breath. 18 g prn  . gabapentin (NEURONTIN) 100 MG capsule Take 1 capsule (100 mg total) by mouth 2 (two) times daily. 60 capsule 5  . omeprazole (PRILOSEC) 40 MG capsule Take 1 capsule (40 mg total) by mouth daily. 30 capsule 2  . zonisamide (ZONEGRAN) 25 MG capsule Take 1 capsule (25 mg total) by mouth daily. 30 capsule 5  . fluconazole (DIFLUCAN) 150 MG tablet Take 1 tablet (150 mg total) by mouth daily. Repeat in 1 week if needed 2 tablet 0  . metroNIDAZOLE (FLAGYL) 500 MG tablet Take 1 tablet (500 mg total) by mouth 2 (two) times daily. 14 tablet 0   No facility-administered medications prior to visit.    Review of Systems  Constitutional: Negative for chills and fever.       Sweats a/w menopause  HENT: Negative for congestion and sore throat.   Respiratory: Positive for cough, sputum production, shortness of breath and wheezing.   Cardiovascular: Positive for palpitations. Negative for chest pain and leg swelling.  Gastrointestinal: Positive for heartburn. Negative for abdominal pain, blood in stool, melena, nausea and vomiting.  Genitourinary: Negative for hematuria.  Musculoskeletal: Negative for joint pain and myalgias.  Endo/Heme/Allergies: Positive for environmental allergies. Does not bruise/bleed easily.     Objective:   Vitals:   07/17/19 1043  BP: 112/66  Pulse: 70   Temp: (!) 97.2 F (36.2 C)  TempSrc: Temporal  SpO2: 98%  Weight: 72 lb (32.7 kg)  Height: 5\' 4"  (1.626 m)   98% on   RA BMI Readings from Last 3 Encounters:  07/17/19 12.36 kg/m  06/27/19 46.52 kg/m  06/10/19 46.24 kg/m   Wt Readings from Last 3 Encounters:  07/17/19 72 lb (32.7 kg)  06/27/19 271 lb (122.9 kg)  06/10/19 273 lb 9.6 oz (124.1 kg)    Physical Exam Vitals reviewed.  Constitutional:      General: She is not in acute distress.    Appearance: Normal appearance. She is obese.  HENT:     Head: Normocephalic and atraumatic.  Eyes:  General: No scleral icterus. Cardiovascular:     Rate and Rhythm: Normal rate and regular rhythm.     Heart sounds: No murmur.  Pulmonary:     Comments: Breathing comfortably on room air, no conversational dyspnea.  Mild coughing triggered by deep inspiration.  Clear to auscultation bilaterally. Abdominal:     General: There is no distension.     Palpations: Abdomen is soft.  Musculoskeletal:        General: No swelling or deformity.     Cervical back: Neck supple.  Lymphadenopathy:     Cervical: No cervical adenopathy.  Skin:    General: Skin is warm and dry.     Findings: No rash.  Neurological:     General: No focal deficit present.     Mental Status: She is alert.     Motor: No weakness.     Coordination: Coordination normal.  Psychiatric:        Mood and Affect: Mood normal.        Behavior: Behavior normal.      CBC    Component Value Date/Time   WBC 4.4 09/19/2017 0900   RBC 4.00 09/19/2017 0900   HGB 11.6 09/19/2017 0900   HCT 36.2 09/19/2017 0900   PLT 292 09/19/2017 0900   MCV 91 09/19/2017 0900   MCH 29.0 09/19/2017 0900   MCHC 32.0 09/19/2017 0900   RDW 13.6 09/19/2017 0900   LYMPHSABS 1.3 09/19/2017 0900   EOSABS 0.2 09/19/2017 0900   BASOSABS 0.0 09/19/2017 0900    CHEMISTRY No results for input(s): NA, K, CL, CO2, GLUCOSE, BUN, CREATININE, CALCIUM, MG, PHOS in the last 168 hours. CrCl  cannot be calculated (Patient's most recent lab result is older than the maximum 21 days allowed.).   Chest Imaging- films reviewed: CXR, 2 view 04/24/2019- right hilar fullness versus cardiomegaly.  Airway thickening and increased lung markings throughout.  Pulmonary Functions Testing Results: No flowsheet data found.      Assessment & Plan:     ICD-10-CM   1. Mild persistent asthma without complication  J45.30 Pulmonary Function Test  2. Allergic rhinitis, unspecified seasonality, unspecified trigger  J30.9   3. Gastroesophageal reflux disease without esophagitis  K21.9     Mild persistent asthma; coughing is her predominant symptom.  Exacerbated since having Covid -Start Flovent twice daily.  Educated her on the importance of regularly using this medication rinsing her mouth after use. -Continue albuterol as needed -PFTs -Recommend Covid (waiting on second shot), pneumonia vaccines.  Up-to-date on seasonal flu vaccine.  GERD -Continue Prilosec daily -Discussed the importance of dietary and lifestyle modifications, especially avoiding eating late, cutting out mints, and consolidating caffeine consumption, ideally in the morning.  Allergic rhinitis -Continue Claritin as needed  RTC in 6 weeks after PFTs.   Current Outpatient Medications:  .  albuterol (PROVENTIL) (2.5 MG/3ML) 0.083% nebulizer solution, Take 3 mLs (2.5 mg total) by nebulization every 6 (six) hours as needed for wheezing or shortness of breath., Disp: 150 mL, Rfl: 0 .  albuterol (VENTOLIN HFA) 108 (90 Base) MCG/ACT inhaler, Inhale 2 puffs into the lungs every 6 (six) hours as needed for wheezing or shortness of breath., Disp: 18 g, Rfl: prn .  gabapentin (NEURONTIN) 100 MG capsule, Take 1 capsule (100 mg total) by mouth 2 (two) times daily., Disp: 60 capsule, Rfl: 5 .  omeprazole (PRILOSEC) 40 MG capsule, Take 1 capsule (40 mg total) by mouth daily., Disp: 30 capsule, Rfl: 2 .  zonisamide (  ZONEGRAN) 25 MG  capsule, Take 1 capsule (25 mg total) by mouth daily., Disp: 30 capsule, Rfl: 5 .  fluticasone (FLOVENT HFA) 110 MCG/ACT inhaler, Inhale 2 puffs into the lungs 2 (two) times daily., Disp: 1 Inhaler, Rfl: 11      Steffanie Dunn, DO [facility] 07/17/2019 11:49 AM

## 2019-07-17 NOTE — Telephone Encounter (Signed)
I have sent a message to our [facility] to please reach out to the pt to schedule her Myoview for pre op clearance.

## 2019-07-17 NOTE — Patient Instructions (Addendum)
Thank you for visiting Dr. Carlis Abbott at [facility]. We recommend the following: Orders Placed This Encounter  Procedures  . Pulmonary Function Test   Orders Placed This Encounter  Procedures  . Pulmonary Function Test    Standing Status:   Future    Standing Expiration Date:   07/16/2020    Order Specific Question:   Where should this test be performed?    Answer:   [facility]    Order Specific Question:   Full PFT: includes the following: basic spirometry, spirometry pre & post bronchodilator, diffusion capacity (DLCO), lung volumes    Answer:   Full PFT    Meds ordered this encounter  Medications  . fluticasone (FLOVENT HFA) 110 MCG/ACT inhaler    Sig: Inhale 2 puffs into the lungs 2 (two) times daily.    Dispense:  1 Inhaler    Refill:  11    Return in about 6 weeks (around 08/28/2019).    Please do your part to reduce the spread of COVID-19.   Food Choices for Gastroesophageal Reflux Disease, Adult When you have gastroesophageal reflux disease (GERD), the foods you eat and your eating habits are very important. Choosing the right foods can help ease your discomfort. Think about working with a nutrition specialist (dietitian) to help you make good choices. What are tips for following this plan?  Meals  Choose healthy foods that are low in fat, such as fruits, vegetables, whole grains, low-fat dairy products, and lean meat, fish, and poultry.  Eat small meals often instead of 3 large meals a day. Eat your meals slowly, and in a place where you are relaxed. Avoid bending over or lying down until 2-3 hours after eating.  Avoid eating meals 2-3 hours before bed.  Avoid drinking a lot of liquid with meals.  Cook foods using methods other than frying. Bake, grill, or broil food instead.  Avoid or limit: ? Chocolate. ? Peppermint or spearmint. ? Alcohol. ? Pepper. ? Black and decaffeinated coffee. ? Black and decaffeinated tea. ? Bubbly (carbonated) soft  drinks. ? Caffeinated energy drinks and soft drinks.  Limit high-fat foods such as: ? Fatty meat or fried foods. ? Whole milk, cream, butter, or ice cream. ? Nuts and nut butters. ? Pastries, donuts, and sweets made with butter or shortening.  Avoid foods that cause symptoms. These foods may be different for everyone. Common foods that cause symptoms include: ? Tomatoes. ? Oranges, lemons, and limes. ? Peppers. ? Spicy food. ? Onions and garlic. ? Vinegar. Lifestyle  Maintain a healthy weight. Ask your doctor what weight is healthy for you. If you need to lose weight, work with your doctor to do so safely.  Exercise for at least 30 minutes for 5 or more days each week, or as told by your doctor.  Wear loose-fitting clothes.  Do not smoke. If you need help quitting, ask your doctor.  Sleep with the head of your bed higher than your feet. Use a wedge under the mattress or blocks under the bed frame to raise the head of the bed. Summary  When you have gastroesophageal reflux disease (GERD), food and lifestyle choices are very important in easing your symptoms.  Eat small meals often instead of 3 large meals a day. Eat your meals slowly, and in a place where you are relaxed.  Limit high-fat foods such as fatty meat or fried foods.  Avoid bending over or lying down until 2-3 hours after eating.  Avoid peppermint  and spearmint, caffeine, alcohol, and chocolate. This information is not intended to replace advice given to you by your health care provider. Make sure you discuss any questions you have with your health care provider. Document Revised: 06/14/2018 Document Reviewed: 03/29/2016 Elsevier Patient Education  2020 [employer].

## 2019-07-18 NOTE — Telephone Encounter (Signed)
RE: MYOVIEW FOR PRE OP CLEARANCE Received: Today Message Contents  Minette Brine sent to Blanca Friend, Roger Shelter, CMA  Per Delrae Alfred this PA# is still pending with [facility].       Previous Messages   ----- Message -----  From: Pricilla Holm  Sent: 07/18/2019  9:10 AM EDT  To: Minette Brine  Subject: FW: MYOVIEW FOR PRE OP CLEARANCE          ----- Message -----  From: Tarri Fuller, CMA  Sent: 07/17/2019  3:06 PM EDT  To: [facility]  Subject: MYOVIEW FOR PRE OP CLEARANCE           Hi everyone,   Can someone please reach out to the pt and schedule her Myoview. Carly, Dr. Norris Cross nurse has placed Myoview order, just needs to be scheduled for pre op clearance. I appreciate your help.   Thank you  Okey Regal

## 2019-07-19 ENCOUNTER — Other Ambulatory Visit: Payer: Self-pay

## 2019-07-19 ENCOUNTER — Ambulatory Visit (HOSPITAL_COMMUNITY): Payer: 59 | Attending: Cardiovascular Disease

## 2019-07-19 ENCOUNTER — Ambulatory Visit (INDEPENDENT_AMBULATORY_CARE_PROVIDER_SITE_OTHER): Payer: 59

## 2019-07-19 ENCOUNTER — Ambulatory Visit: Payer: 59

## 2019-07-19 VITALS — BP 118/66 | HR 60

## 2019-07-19 DIAGNOSIS — Z01818 Encounter for other preprocedural examination: Secondary | ICD-10-CM | POA: Diagnosis not present

## 2019-07-19 DIAGNOSIS — R0602 Shortness of breath: Secondary | ICD-10-CM

## 2019-07-19 DIAGNOSIS — R002 Palpitations: Secondary | ICD-10-CM | POA: Diagnosis not present

## 2019-07-19 DIAGNOSIS — R55 Syncope and collapse: Secondary | ICD-10-CM

## 2019-07-19 MED ORDER — PERFLUTREN LIPID MICROSPHERE
1.0000 mL | INTRAVENOUS | Status: AC | PRN
  Administered 2019-07-19: 1 mL via INTRAVENOUS

## 2019-07-19 NOTE — Progress Notes (Signed)
Pt came in for Echo and nurse visit for preop clearance per Dr. Mayford Knife.   She says she is feeling well today.   EKG performed after her echo showing NSR with R 60.   Will forward to Dr. Mayford Knife and pt still            waiting for her Myoview to be preauthorized and will reach out to her after all testing is complete for her clearance if appropriate.   Per Dr. Excell Seltzer the DOD.Tiffany Jones pt EKG WNL

## 2019-07-24 NOTE — Telephone Encounter (Signed)
We are still awaiting on authorization from insurance company. I think that we should an FYI to surgeon Dr. Abigail Miyamoto.

## 2019-07-24 NOTE — Telephone Encounter (Signed)
Pre-op team   I still do not see if this patients stress test has been scheduled and if not scheduled, if we are still waiting for pre-authorization.    Please let me know what you find. Is there someone else who I need to send this to?   Thanks [place]

## 2019-07-25 NOTE — Telephone Encounter (Signed)
I have sent a message to [employer]. To see if she could please check on the Myoview to see if the insurance has given approval.

## 2019-07-31 NOTE — Telephone Encounter (Signed)
Looks like pt has 2 day Myoview scheduled 6-17 & 6-18 scheduled. Will await results.

## 2019-08-19 ENCOUNTER — Other Ambulatory Visit (HOSPITAL_COMMUNITY): Payer: 59

## 2019-08-20 ENCOUNTER — Telehealth (HOSPITAL_COMMUNITY): Payer: Self-pay

## 2019-08-20 NOTE — Telephone Encounter (Signed)
Spoke with the patient, she stated that she would be here for her test. Asked to call back with any questions. S.Markeita Alicia EMTP ?

## 2019-08-21 ENCOUNTER — Telehealth (HOSPITAL_COMMUNITY): Payer: Self-pay | Admitting: Cardiology

## 2019-08-21 DIAGNOSIS — R0602 Shortness of breath: Secondary | ICD-10-CM

## 2019-08-21 NOTE — Addendum Note (Signed)
Addended by: Theresia Majors on: 08/21/2019 01:30 PM   Modules accepted: Orders

## 2019-08-21 NOTE — Telephone Encounter (Signed)
Just set up ETT

## 2019-08-21 NOTE — Telephone Encounter (Signed)
Per [employer] denied the [place]. I spoke with patient and informed her of the situation and I also offered [place] to still do test as a SELF PAY but patient did decline that option. She states that she received a letter stating Insurance denied due to the type of stress test that was ordered.  Will you please follow up with patient for possible options other than Myoview. This order will be removed from the WQ.

## 2019-08-21 NOTE — Telephone Encounter (Signed)
Spoke with the patient and she is aware that orders have been changed to ETT.

## 2019-08-22 ENCOUNTER — Ambulatory Visit (HOSPITAL_COMMUNITY): Payer: 59

## 2019-08-23 ENCOUNTER — Ambulatory Visit (HOSPITAL_COMMUNITY): Payer: 59

## 2019-08-26 NOTE — Progress Notes (Signed)
Cardiology Office Note    Date:  08/28/2019   ID:  Tiffany Jones, DOB 1966/02/15, MRN [id number]  PCP:  Cain Saupe, MD  Cardiologist: Armanda Magic, MD EPS: None  Chief Complaint  Patient presents with  . Follow-up    History of Present Illness:  Tiffany Jones is a 54 y.o. female with history of asthma, GERD, migraines.  Patient saw Dr. Mayford Knife via video visit 04/30/2019 for evaluation of tachycardia.  Since having COVID-19 she had frequent bouts of palpitations and elevated blood pressures.  It was felt that albuterol could be contributing.  She also has a history of syncope with a work-up in [place] in 2012 that was normal.  Apparently a tilt table test was recommended but she refused.  She had syncope with Covid.  2D echo showed normal LVEF 60 to 65% with grade 1 DD, GXT myoview scheduled for 09/10/2019, event monitor without arrhythmias but appeared to have transient ST changes therefore stress test was ordered.  Patient comes in for f/u. A little nauseated coming in here today. Still having palpitations at night and trouble sleeping. Drinks 1 cup of coffee daily and some sweet tea. Not using albuterol inhaler as much.  Walks about 1/4 mile once daily.     Past Medical History:  Diagnosis Date  . Asthma   . Enlarged heart   . Gastroesophageal reflux disease without esophagitis 05/17/2018  . GERD (gastroesophageal reflux disease)   . History of abnormal mammogram   . Migraine without status migrainosus, not intractable 05/17/2018  . Migraines   . Sleep disturbance 05/17/2018    Past Surgical History:  Procedure Laterality Date  . ECTOPIC PREGNANCY SURGERY    . Right breast biopsy      Current Medications: Current Meds  Medication Sig  . albuterol (PROVENTIL) (2.5 MG/3ML) 0.083% nebulizer solution Take 3 mLs (2.5 mg total) by nebulization every 6 (six) hours as needed for wheezing or shortness of breath.  Marland Kitchen albuterol (VENTOLIN HFA) 108 (90 Base) MCG/ACT  inhaler Inhale 2 puffs into the lungs every 6 (six) hours as needed for wheezing or shortness of breath.  . fluticasone (FLOVENT HFA) 110 MCG/ACT inhaler Inhale 2 puffs into the lungs 2 (two) times daily.  Marland Kitchen gabapentin (NEURONTIN) 100 MG capsule Take 1 capsule (100 mg total) by mouth 2 (two) times daily.  Marland Kitchen omeprazole (PRILOSEC) 40 MG capsule Take 1 capsule (40 mg total) by mouth daily.  Marland Kitchen zonisamide (ZONEGRAN) 25 MG capsule Take 1 capsule (25 mg total) by mouth daily.     Allergies:   Sulfa antibiotics   Social History   Socioeconomic History  . Marital status: Widowed    Spouse name: Not on file  . Number of children: Not on file  . Years of education: Not on file  . Highest education level: Not on file  Occupational History  . Not on file  Tobacco Use  . Smoking status: Never Smoker  . Smokeless tobacco: Never Used  Vaping Use  . Vaping Use: Never used  Substance and Sexual Activity  . Alcohol use: Yes    Comment: socially  . Drug use: Never  . Sexual activity: Not Currently  Other Topics Concern  . Not on file  Social History Narrative  . Not on file   Social Determinants of Health   Financial Resource Strain:   . Difficulty of Paying Living Expenses:   Food Insecurity:   . Worried About Programme researcher, broadcasting/film/video in the Last Year:   .  Ran Out of Food in the Last Year:   Transportation Needs:   . Freight forwarder (Medical):   Marland Kitchen Lack of Transportation (Non-Medical):   Physical Activity:   . Days of Exercise per Week:   . Minutes of Exercise per Session:   Stress:   . Feeling of Stress :   Social Connections:   . Frequency of Communication with Friends and Family:   . Frequency of Social Gatherings with Friends and Family:   . Attends Religious Services:   . Active Member of Clubs or Organizations:   . Attends Banker Meetings:   Marland Kitchen Marital Status:      Family History:  The patient's family history includes Alcohol abuse in her father; Asthma in  her son; Breast cancer (age of onset: 60) in her mother; Hypertension in her father; Lymphoma in her maternal grandfather.   ROS:   Please see the history of present illness.    ROS All other systems reviewed and are negative.   PHYSICAL EXAM:   VS:  BP 122/78   Pulse 87   Ht 5\' 4"  (1.626 m)   Wt 269 lb 6.4 oz (122.2 kg)   LMP 07/18/2017   SpO2 97%   BMI 46.24 kg/m   Physical Exam  GEN: Obese, in no acute distress  Neck: no JVD, carotid bruits, or masses Cardiac:RRR; no murmurs, rubs, or gallops  Respiratory:  clear to auscultation bilaterally, normal work of breathing GI: soft, nontender, nondistended, + BS Ext: without cyanosis, clubbing, or edema, Good distal pulses bilaterally Neuro:  Alert and Oriented x 3 Psych: euthymic mood, full affect  Wt Readings from Last 3 Encounters:  08/28/19 269 lb 6.4 oz (122.2 kg)  07/17/19 72 lb (32.7 kg)  06/27/19 271 lb (122.9 kg)      Studies/Labs Reviewed:   EKG:  EKG is not ordered today.    Recent Labs: No results found for requested labs within last 8760 hours.   Lipid Panel    Component Value Date/Time   CHOL 230 (H) 09/19/2017 0900   TRIG 81 09/19/2017 0900   HDL 54 09/19/2017 0900   CHOLHDL 4.3 09/19/2017 0900   LDLCALC 160 (H) 09/19/2017 0900    Additional studies/ records that were reviewed today include:  Echo 07/19/2019 IMPRESSIONS     1. Left ventricular ejection fraction, by estimation, is 60 to 65%. The  left ventricle has normal function. The left ventricle has no regional  wall motion abnormalities. Left ventricular diastolic parameters are  consistent with Grade I diastolic  dysfunction (impaired relaxation).   2. Right ventricular systolic function is normal. The right ventricular  size is normal.   3. The mitral valve is normal in structure. No evidence of mitral valve  regurgitation. No evidence of mitral stenosis.   4. The aortic valve is normal in structure. Aortic valve regurgitation is  not  visualized. No aortic stenosis is present.   5. The inferior vena cava is normal in size with greater than 50%  respiratory variability, suggesting right atrial pressure of 3 mmHg.   FINDINGS   Left Ventricle: Left ventricular ejection fraction, by estimation, is 60  to 65%. The left ventricle has normal function. The left ventricle has no  regional wall motion abnormalities. Definity contrast agent was given IV  to delineate the left ventricular   endocardial borders. The left ventricular internal cavity size was normal  in size. There is no left ventricular hypertrophy. Left ventricular  diastolic parameters  are consistent with Grade I diastolic dysfunction  (impaired relaxation). Normal left  ventricular filling pressure.   Right Ventricle: The right ventricular size is normal. No increase in  right ventricular wall thickness. Right ventricular systolic function is  normal.   Left Atrium: Left atrial size was normal in size.   Right Atrium: Right atrial size was normal in size.   Pericardium: There is no evidence of pericardial effusion.   Mitral Valve: The mitral valve is normal in structure. Normal mobility of  the mitral valve leaflets. No evidence of mitral valve regurgitation. No  evidence of mitral valve stenosis.   Tricuspid Valve: The tricuspid valve is normal in structure. Tricuspid  valve regurgitation is not demonstrated. No evidence of tricuspid  stenosis.   Aortic Valve: The aortic valve is normal in structure. Aortic valve  regurgitation is not visualized. No aortic stenosis is present.   Pulmonic Valve: The pulmonic valve was normal in structure. Pulmonic valve  regurgitation is not visualized. No evidence of pulmonic stenosis.   Aorta: The aortic root is normal in size and structure.   Venous: The inferior vena cava is normal in size with greater than 50%  respiratory variability, suggesting right atrial pressure of 3 mmHg.   IAS/Shunts: No atrial level  shunt detected by color flow Doppler.     Holter monitor 4/2021Sinus bradycardia, normal sinus rhythm and sinus tachycardia. The average heart rate was 79bpm and ranged from 49-129bpm.  There appears to be transient ST changes on some of the recordings.       ASSESSMENT:    1. Tachycardia   2. History of syncope   3. Morbid obesity ([place])      PLAN:  In order of problems listed above:  Tachycardia no significant arrhythmias on Holter monitor but variable ST changes, 2D echo with normal LVEF-GXT myoview scheduled for 09/10/2019.  Still having some rapid heart rates especially at night.  Holter monitor showed some sinus tachycardia up to 129 but no arrhythmias.  Reduce sweet tea and caffeinated products in her diet.  History of syncope sounds vasovagal most recent episode occurred during COVID-19 infection, previous work-up in [place] was normal but she declined what sounds like a tilt table test  Morbid obesity increase exercise and weight loss    Medication Adjustments/Labs and Tests Ordered: Current medicines are reviewed at length with the patient today.  Concerns regarding medicines are outlined above.  Medication changes, Labs and Tests ordered today are listed in the Patient Instructions below. Patient Instructions  Medication Instructions:  Your physician recommends that you continue on your current medications as directed. Please refer to the Current Medication list given to you today.  *If you need a refill on your cardiac medications before your next appointment, please call your pharmacy*   Lab Work: None  If you have labs (blood work) drawn today and your tests are completely normal, you will receive your results only by: Marland Kitchen MyChart Message (if you have MyChart) OR . A paper copy in the mail If you have any lab test that is abnormal or we need to change your treatment, we will call you to review the results.   Testing/Procedures: Keep stress test appointments  on 09/10/19 and 09/11/19   Follow-Up: Follow up with Dr. Radford Pax on 09/30/19 at 11:00 AM   Other Instructions Avoid caffeine and sugar in your diet.  Your provider recommends that you maintain 150 minutes per week of moderate aerobic activity.  Elson Clan, PA-C  08/28/2019 11:23 AM    [facility] [address] [place], [place], [place]  [address] Phone: [phone]; Fax: [phone]

## 2019-08-27 ENCOUNTER — Other Ambulatory Visit: Payer: Self-pay

## 2019-08-27 DIAGNOSIS — R55 Syncope and collapse: Secondary | ICD-10-CM

## 2019-08-27 DIAGNOSIS — R002 Palpitations: Secondary | ICD-10-CM

## 2019-08-27 DIAGNOSIS — R0602 Shortness of breath: Secondary | ICD-10-CM

## 2019-08-27 DIAGNOSIS — Z01818 Encounter for other preprocedural examination: Secondary | ICD-10-CM

## 2019-08-28 ENCOUNTER — Encounter: Payer: Self-pay | Admitting: Physician Assistant

## 2019-08-28 ENCOUNTER — Ambulatory Visit (INDEPENDENT_AMBULATORY_CARE_PROVIDER_SITE_OTHER): Payer: 59 | Admitting: Physician Assistant

## 2019-08-28 ENCOUNTER — Other Ambulatory Visit: Payer: Self-pay

## 2019-08-28 VITALS — BP 122/78 | HR 87 | Ht 64.0 in | Wt 269.4 lb

## 2019-08-28 DIAGNOSIS — Z87898 Personal history of other specified conditions: Secondary | ICD-10-CM | POA: Diagnosis not present

## 2019-08-28 DIAGNOSIS — R000 Tachycardia, unspecified: Secondary | ICD-10-CM | POA: Diagnosis not present

## 2019-08-28 NOTE — Patient Instructions (Signed)
Medication Instructions:  Your physician recommends that you continue on your current medications as directed. Please refer to the Current Medication list given to you today.  *If you need a refill on your cardiac medications before your next appointment, please call your pharmacy*   Lab Work: None  If you have labs (blood work) drawn today and your tests are completely normal, you will receive your results only by: Marland Kitchen MyChart Message (if you have MyChart) OR . A paper copy in the mail If you have any lab test that is abnormal or we need to change your treatment, we will call you to review the results.   Testing/Procedures: Keep stress test appointments on 09/10/19 and 09/11/19   Follow-Up: Follow up with Dr. Mayford Knife on 09/30/19 at 11:00 AM   Other Instructions Avoid caffeine and sugar in your diet.  Your provider recommends that you maintain 150 minutes per week of moderate aerobic activity.

## 2019-08-30 ENCOUNTER — Ambulatory Visit (HOSPITAL_COMMUNITY): Admission: RE | Admit: 2019-08-30 | Payer: 59 | Source: Ambulatory Visit | Attending: Cardiology | Admitting: Cardiology

## 2019-08-31 ENCOUNTER — Other Ambulatory Visit (HOSPITAL_COMMUNITY)
Admission: RE | Admit: 2019-08-31 | Discharge: 2019-08-31 | Disposition: A | Discharge status: Home / Self Care | Payer: 59 | Source: Ambulatory Visit | Attending: Critical Care Medicine | Admitting: Critical Care Medicine

## 2019-08-31 DIAGNOSIS — Z01812 Encounter for preprocedural laboratory examination: Secondary | ICD-10-CM | POA: Diagnosis present

## 2019-08-31 DIAGNOSIS — Z20822 Contact with and (suspected) exposure to covid-19: Secondary | ICD-10-CM | POA: Diagnosis not present

## 2019-08-31 LAB — SARS CORONAVIRUS 2 (TAT 6-24 HRS): SARS Coronavirus 2: NEGATIVE

## 2019-09-02 ENCOUNTER — Other Ambulatory Visit: Payer: Self-pay | Admitting: *Deleted

## 2019-09-02 DIAGNOSIS — R0602 Shortness of breath: Secondary | ICD-10-CM

## 2019-09-04 ENCOUNTER — Ambulatory Visit (INDEPENDENT_AMBULATORY_CARE_PROVIDER_SITE_OTHER): Payer: 59 | Admitting: Acute Care

## 2019-09-04 ENCOUNTER — Other Ambulatory Visit: Payer: Self-pay

## 2019-09-04 ENCOUNTER — Encounter: Payer: Self-pay | Admitting: Acute Care

## 2019-09-04 ENCOUNTER — Ambulatory Visit (INDEPENDENT_AMBULATORY_CARE_PROVIDER_SITE_OTHER): Payer: 59 | Admitting: Critical Care Medicine

## 2019-09-04 ENCOUNTER — Ambulatory Visit: Payer: 59 | Admitting: Critical Care Medicine

## 2019-09-04 DIAGNOSIS — J4530 Mild persistent asthma, uncomplicated: Secondary | ICD-10-CM

## 2019-09-04 DIAGNOSIS — R0600 Dyspnea, unspecified: Secondary | ICD-10-CM

## 2019-09-04 DIAGNOSIS — K219 Gastro-esophageal reflux disease without esophagitis: Secondary | ICD-10-CM | POA: Diagnosis not present

## 2019-09-04 DIAGNOSIS — J309 Allergic rhinitis, unspecified: Secondary | ICD-10-CM | POA: Diagnosis not present

## 2019-09-04 DIAGNOSIS — R05 Cough: Secondary | ICD-10-CM

## 2019-09-04 DIAGNOSIS — R059 Cough, unspecified: Secondary | ICD-10-CM

## 2019-09-04 DIAGNOSIS — R0609 Other forms of dyspnea: Secondary | ICD-10-CM

## 2019-09-04 LAB — PULMONARY FUNCTION TEST
DL/VA % pred: 127 %
DL/VA: 5.47 ml/min/mmHg/L
DLCO cor % pred: 87 %
DLCO cor: 18.18 ml/min/mmHg
DLCO unc % pred: 87 %
DLCO unc: 18.18 ml/min/mmHg
FEF 25-75 Post: 2.93 L/sec
FEF 25-75 Pre: 1.88 L/sec
FEF2575-%Change-Post: 56 %
FEF2575-%Pred-Post: 123 %
FEF2575-%Pred-Pre: 79 %
FEV1-%Change-Post: 10 %
FEV1-%Pred-Post: 82 %
FEV1-%Pred-Pre: 74 %
FEV1-Post: 1.88 L
FEV1-Pre: 1.7 L
FEV1FVC-%Change-Post: 4 %
FEV1FVC-%Pred-Pre: 104 %
FEV6-%Change-Post: 5 %
FEV6-%Pred-Post: 76 %
FEV6-%Pred-Pre: 72 %
FEV6-Post: 2.12 L
FEV6-Pre: 2.01 L
FEV6FVC-%Pred-Post: 102 %
FEV6FVC-%Pred-Pre: 102 %
FVC-%Change-Post: 5 %
FVC-%Pred-Post: 74 %
FVC-%Pred-Pre: 70 %
FVC-Post: 2.12 L
FVC-Pre: 2.01 L
Post FEV1/FVC ratio: 89 %
Post FEV6/FVC ratio: 100 %
Pre FEV1/FVC ratio: 85 %
Pre FEV6/FVC Ratio: 100 %
RV % pred: 99 %
RV: 1.81 L
TLC % pred: 91 %
TLC: 4.63 L

## 2019-09-04 NOTE — Progress Notes (Signed)
PFT done today. 

## 2019-09-04 NOTE — Progress Notes (Signed)
History of Present Illness Tiffany Jones is a 54 y.o. female with adult onset  Asthma at the age of 29.Marland Kitchen She is followed by Dr. Chestine Spore  Synopsis 54 year old woman with a history of asthma diagnosed around the age 61 after an episode of pneumonia . Her asthma has been worse since having Covid in January 2021.  She had a persistent cough for several months after having Covid which is finally started to improve.  She was using albuterol 2-3 times per day for several weeks after having Covid.  Her symptoms are improved but she is not back to her baseline.  She continues to have phlegm that causes shortness of breath.  She has never been on maintenance inhalers.  She has 2 sons with asthma, but there is no other family history of lung disease.  She has never been hospitalized for asthma, but had an urgent care visit recently where she was given prednisone for her severe coughing, which improved her symptoms transiently.  Since being off prednisone she has not needed her rescue inhaler, but she has began coughing again slightly.  She denies wheezing and nighttime symptoms.  Warmer weather in general makes her symptoms worse.  She has no previous history of smoking or vaping.  She is chronic sinus allergies with congestion, sneezing, watery eyes, and coughing, but these have not been bad recently.  She takes Claritin as needed for allergies.  She has GERD with breakthrough symptoms about 2 days/week.  She takes Prilosec daily.  Her symptoms are triggered by eating citrus, spicy foods.  She drinks coffee but seldom drinks soda.  She does eat a lot of mints.  She was seen by Dr. Chestine Spore 07/17/2019. Recommendations included starting Flovent 2 puffs twice daily, albuterol for rescue , getting second Covid vaccine, PFT's. Continue Prilosec daily, follow GERD diet, avoid late meals, mints,caffeine, and to continue claritin as needed.    09/04/2019  Pt. Presents for follow up. She has been compliant with her  Flovent twice daily. She states she does not really see a difference in her breathing or cough,  , but she states she is not coughing as much. She has not had to  use her rescue inhaler. She realizes that the Flovent is actually  Helping as we were discussing her decrease in  rescue inhaler use and cough. We reviewed the proper use of Flovent. The patient was only using it once daily. We have educated her on proper dosing. She states she will correct this today. She does have a hoarse voice, but she states she has had this for many years. She is working on losing weight. We will refer her to healthy weight and wellness. She states she does not notice if she is wheezing. She is coughing up secretions, they are clear. She is taking claritin D. She is compliant with her omeprazole daily.She has had both of her Moderna vaccines.    Test Results: 09/04/2019 PFT FVC 2.01/2.85/70% predicted FEV1 1.70/2.28/74% predicted F/F  ratio 85/81 104% predicted TLC 4.63/5.05/91% DLCO 18.18/20.88/87% predicted DLCO/VA 68% predicted      Chest Imaging- films reviewed: CXR, 2 view 04/24/2019- right hilar fullness versus cardiomegaly.  Airway thickening and increased lung markings throughout.   CBC Latest Ref Rng & Units 09/19/2017  WBC 3.4 - 10.8 x10E3/uL 4.4  Hemoglobin 11.1 - 15.9 g/dL 23.7  Hematocrit 62.8 - 46.6 % 36.2  Platelets 150 - 450 x10E3/uL 292    BMP Latest Ref Rng & Units 07/18/2018 09/19/2017  Glucose 65 - 99 mg/dL 630(Z) 601(U)  BUN 6 - 24 mg/dL 12 11  Creatinine 9.32 - 1.00 mg/dL 3.55 7.32  BUN/Creat Ratio 9 - 23 14 14   Sodium 134 - 144 mmol/L 141 141  Potassium 3.5 - 5.2 mmol/L 4.6 4.4  Chloride 96 - 106 mmol/L 105 104  CO2 20 - 29 mmol/L 22 22  Calcium 8.7 - 10.2 mg/dL 9.5 9.4    BNP No results found for: BNP  ProBNP No results found for: PROBNP  PFT    Component Value Date/Time   FEV1PRE 1.70 09/04/2019 1449   FEV1POST 1.88 09/04/2019 1449   FVCPRE 2.01 09/04/2019 1449    FVCPOST 2.12 09/04/2019 1449   TLC 4.63 09/04/2019 1449   DLCOUNC 18.18 09/04/2019 1449   PREFEV1FVCRT 85 09/04/2019 1449   PSTFEV1FVCRT 89 09/04/2019 1449    No results found.   Past medical hx Past Medical History:  Diagnosis Date  . Asthma   . Enlarged heart   . Gastroesophageal reflux disease without esophagitis 05/17/2018  . GERD (gastroesophageal reflux disease)   . History of abnormal mammogram   . Migraine without status migrainosus, not intractable 05/17/2018  . Migraines   . Sleep disturbance 05/17/2018     Social History   Tobacco Use  . Smoking status: Never Smoker  . Smokeless tobacco: Never Used  Vaping Use  . Vaping Use: Never used  Substance Use Topics  . Alcohol use: Yes    Comment: socially  . Drug use: Never    Tobacco Cessation: Never smoker  Past surgical hx, Family hx, Social hx all reviewed.  Current Outpatient Medications on File Prior to Visit  Medication Sig  . albuterol (PROVENTIL) (2.5 MG/3ML) 0.083% nebulizer solution Take 3 mLs (2.5 mg total) by nebulization every 6 (six) hours as needed for wheezing or shortness of breath.  07/17/2018 albuterol (VENTOLIN HFA) 108 (90 Base) MCG/ACT inhaler Inhale 2 puffs into the lungs every 6 (six) hours as needed for wheezing or shortness of breath.  . fluticasone (FLOVENT HFA) 110 MCG/ACT inhaler Inhale 2 puffs into the lungs 2 (two) times daily.  Marland Kitchen gabapentin (NEURONTIN) 100 MG capsule Take 1 capsule (100 mg total) by mouth 2 (two) times daily.  Marland Kitchen omeprazole (PRILOSEC) 40 MG capsule Take 1 capsule (40 mg total) by mouth daily.  Marland Kitchen zonisamide (ZONEGRAN) 25 MG capsule Take 1 capsule (25 mg total) by mouth daily.   No current facility-administered medications on file prior to visit.     Allergies  Allergen Reactions  . Sulfa Antibiotics Hives    Review Of Systems:  Constitutional:   No  weight loss, night sweats,  Fevers, chills, fatigue, or  lassitude.  HEENT:   No headaches,  Difficulty swallowing,   Tooth/dental problems, or  Sore throat,                No sneezing, itching, ear ache, nasal congestion, post nasal drip,   CV:  No chest pain,  Orthopnea, PND, swelling in lower extremities, anasarca, dizziness, palpitations, syncope.   GI  Occasional heartburn, indigestion, No abdominal pain, nausea, vomiting, diarrhea, change in bowel habits, loss of appetite, bloody stools.   Resp: Occasional ( but better)  shortness of breath with exertion none  at rest.  No excess mucus, no productive cough,  + non-productive cough,  No coughing up of blood.  No change in color of mucus.  No wheezing.  No chest wall deformity  Skin: no rash or lesions.  GU: no dysuria, change in color of urine, no urgency or frequency.  No flank pain, no hematuria   MS:  No joint pain or swelling.  No decreased range of motion.  No back pain.  Psych:  No change in mood or affect. No depression or anxiety.  No memory loss.   Vital Signs BP 120/80 (BP Location: Left Arm, Cuff Size: Normal)   Pulse 61   Temp 97.6 F (36.4 C) (Oral)   Ht 5\' 4"  (1.626 m)   Wt 270 lb 3.2 oz (122.6 kg)   LMP 07/18/2017   SpO2 100%   BMI 46.38 kg/m    Physical Exam:  General- No distress,  A&Ox3, pleasant ENT: No sinus tenderness, TM clear, pale nasal mucosa, no oral exudate,no post nasal drip, no LAN Cardiac: S1, S2, regular rate and rhythm, no murmur Chest: No wheeze/ rales/ dullness; no accessory muscle use, no nasal flaring, no sternal retractions, diminished per bases Abd.: Soft Non-tender, ND, BS +, Body mass index is 46.38 kg/m. Ext: No clubbing cyanosis,trace BLE  edema Neuro:  normal strength, MAE x 4, A&O x 3, apprpriate Skin: No rashes, no lesions, warm and dry Psych: normal mood and behavior   Assessment/Plan  Asthma Plan Continue  Flovent 2 puffs  twice daily.   Rinse mouth after use -Continue albuterol as needed for shortness of breath or wheezing -PFT's today do not show obstruction ,  but show more of  a restrictive pattern that may be related to your weight. - Consider FENO testing - Continue  To remain Up-to-date on all seasonal flu vaccine. - Watch for recommendations for COVID boosters.  GERD Plan -Continue Prilosec daily -Discussed the importance of dietary and lifestyle modifications, especially avoiding eating late, cutting out mints, and consolidating caffeine consumption, ideally in the morning.  Allergic rhinitis Plan -Continue Claritin as needed - Consider adding Singulair  Cough Plan Sips of water instead of throat clearing Sugar Free Jolly Ranchers or Werther's originals for throat soothing. Delsym Cough syrup 5 cc's every 12 hours Non-sedating antihistamine of your choice daily ( Zyrtec, Allegra, Xyzol, Claritin ( Generic ok) Avoid mints  Weight Body mass index is 46.38 kg/m. Plan Continue to work on weight loss We will refer you to Healthy weight and wellness clinic Follow up with PCP about having your Thyroid evaluated.   Follow up  Plan Follow up with Dr. 07/20/2017 in 3 months  If you need Chestine Spore sooner call.  Please contact office for sooner follow up if symptoms do not improve or worsen or seek emergency care      [place], NP 09/04/2019  5:00 PM

## 2019-09-04 NOTE — Patient Instructions (Addendum)
It is nice to meet you today  Asthma - Continue  Flovent 2 puffs  twice daily.   Rinse mouth after use -Continue albuterol as needed for shortness of breath or wheezing -PFT's today do not show obstruction , but show more of a restrictive pattern that may be related to your weight.  - Continue  To remain Up-to-date on all seasonal flu vaccine. - Watch for recommendations for COVID boosters.  GERD -Continue Prilosec daily -Discussed the importance of dietary and lifestyle modifications, especially avoiding eating late, cutting out mints, and consolidating caffeine consumption, ideally in the morning.  Allergic rhinitis -Continue Claritin as needed - Consider adding Singulair  Cough Sips of water instead of throat clearing Sugar Free Coca-Cola or Werther's originals for throat soothing. Delsym Cough syrup 5 cc's every 12 hours Non-sedating antihistamine of your choice daily ( Zyrtec, Allegra, Xyzol, Claritin ( Generic ok)  We will refer you to Healthy weight and wellness clinic Follow up with PCP about having your Thyroid evaluated.  Follow up with Dr. Chestine Spore in 3 months  If you need [place] sooner call.  Please contact office for sooner follow up if symptoms do not improve or worsen or seek emergency care

## 2019-09-06 ENCOUNTER — Telehealth (HOSPITAL_COMMUNITY): Payer: Self-pay | Admitting: *Deleted

## 2019-09-06 NOTE — Telephone Encounter (Signed)
Patient given detailed instructions per Myocardial Perfusion Study Information Sheet for the test on 09/10/19 at 1:15. Patient notified to arrive 15 minutes early and that it is imperative to arrive on time for appointment to keep from having the test rescheduled.  If you need to cancel or reschedule your appointment, please call the office within 24 hours of your appointment. . Patient verbalized understanding.Tiffany Jones

## 2019-09-10 ENCOUNTER — Other Ambulatory Visit: Payer: Self-pay

## 2019-09-10 ENCOUNTER — Ambulatory Visit (HOSPITAL_COMMUNITY): Payer: 59 | Attending: Cardiology

## 2019-09-10 ENCOUNTER — Encounter: Payer: Self-pay | Admitting: Acute Care

## 2019-09-10 VITALS — Ht 64.0 in | Wt 270.0 lb

## 2019-09-10 DIAGNOSIS — R0602 Shortness of breath: Secondary | ICD-10-CM

## 2019-09-10 DIAGNOSIS — R002 Palpitations: Secondary | ICD-10-CM | POA: Insufficient documentation

## 2019-09-10 DIAGNOSIS — R55 Syncope and collapse: Secondary | ICD-10-CM | POA: Diagnosis present

## 2019-09-10 DIAGNOSIS — Z01818 Encounter for other preprocedural examination: Secondary | ICD-10-CM | POA: Diagnosis not present

## 2019-09-10 DIAGNOSIS — R110 Nausea: Secondary | ICD-10-CM | POA: Diagnosis present

## 2019-09-10 MED ORDER — TECHNETIUM TC 99M TETROFOSMIN IV KIT
32.0000 | PACK | Freq: Once | INTRAVENOUS | Status: AC | PRN
  Administered 2019-09-10: 32 via INTRAVENOUS
  Filled 2019-09-10: qty 32

## 2019-09-10 MED ORDER — AMINOPHYLLINE 25 MG/ML IV SOLN
75.0000 mg | Freq: Once | INTRAVENOUS | Status: AC
  Administered 2019-09-10: 75 mg via INTRAVENOUS

## 2019-09-10 MED ORDER — REGADENOSON 0.4 MG/5ML IV SOLN
0.4000 mg | Freq: Once | INTRAVENOUS | Status: AC
  Administered 2019-09-10: 0.4 mg via INTRAVENOUS

## 2019-09-11 ENCOUNTER — Ambulatory Visit (HOSPITAL_COMMUNITY): Payer: 59 | Attending: Cardiology

## 2019-09-11 LAB — MYOCARDIAL PERFUSION IMAGING
LV dias vol: 80 mL (ref 46–106)
LV sys vol: 30 mL
Peak HR: 134 {beats}/min
Rest HR: 115 {beats}/min
SDS: 5
SRS: 0
SSS: 5
TID: 0.9

## 2019-09-11 MED ORDER — TECHNETIUM TC 99M TETROFOSMIN IV KIT
32.5000 | PACK | Freq: Once | INTRAVENOUS | Status: AC | PRN
  Administered 2019-09-11: 32.5 via INTRAVENOUS
  Filled 2019-09-11: qty 33

## 2019-09-12 ENCOUNTER — Ambulatory Visit: Payer: 59 | Admitting: Family Medicine

## 2019-09-30 ENCOUNTER — Ambulatory Visit: Payer: 59 | Admitting: Cardiology

## 2019-10-07 ENCOUNTER — Other Ambulatory Visit: Payer: Self-pay | Admitting: Family

## 2019-10-07 DIAGNOSIS — K219 Gastro-esophageal reflux disease without esophagitis: Secondary | ICD-10-CM

## 2019-10-10 MED ORDER — OMEPRAZOLE 40 MG PO CPDR: 40.0000 mg | DELAYED_RELEASE_CAPSULE | Freq: Every day | ORAL | 2 refills | Status: DC

## 2019-10-10 NOTE — Telephone Encounter (Signed)
Rx sent. Pt called and made aware. 

## 2019-10-10 NOTE — Telephone Encounter (Signed)
Pt called for an update on the Rx refill request for omeprazole (PRILOSEC) 40 MG capsule. Pt stated she is completely out of her medication.

## 2019-10-10 NOTE — Addendum Note (Signed)
Addended by: Lois Huxley, Jeannett Senior L on: 10/10/2019 03:48 PM   Modules accepted: Orders

## 2019-10-29 ENCOUNTER — Ambulatory Visit (INDEPENDENT_AMBULATORY_CARE_PROVIDER_SITE_OTHER): Payer: 59 | Admitting: Cardiology

## 2019-10-29 ENCOUNTER — Other Ambulatory Visit: Payer: Self-pay

## 2019-10-29 ENCOUNTER — Telehealth: Payer: Self-pay | Admitting: *Deleted

## 2019-10-29 ENCOUNTER — Telehealth: Payer: Self-pay

## 2019-10-29 VITALS — BP 120/65 | HR 53 | Ht 64.0 in | Wt 269.0 lb

## 2019-10-29 DIAGNOSIS — R55 Syncope and collapse: Secondary | ICD-10-CM

## 2019-10-29 DIAGNOSIS — R000 Tachycardia, unspecified: Secondary | ICD-10-CM | POA: Diagnosis not present

## 2019-10-29 NOTE — Patient Instructions (Signed)
Medication Instructions:  Your physician recommends that you continue on your current medications as directed. Please refer to the Current Medication list given to you today.  *If you need a refill on your cardiac medications before your next appointment, please call your pharmacy*  Testing/Procedures: Your physician has recommended that you have a sleep study. This test records several body functions during sleep, including: brain activity, eye movement, oxygen and carbon dioxide blood levels, heart rate and rhythm, breathing rate and rhythm, the flow of air through your mouth and nose, snoring, body muscle movements, and chest and belly movement.  Follow-Up: At [facility], you and your health needs are our priority.  As part of our continuing mission to provide you with exceptional heart care, we have created designated Provider Care Teams.  These Care Teams include your primary Cardiologist (physician) and Advanced Practice Providers (APPs -  Physician Assistants and Nurse Practitioners) who all work together to provide you with the care you need, when you need it.  Follow up with Dr. Mayford Knife based on results from testing.

## 2019-10-29 NOTE — Addendum Note (Signed)
Addended by: Theresia Majors on: 10/29/2019 03:37 PM   Modules accepted: Orders

## 2019-10-29 NOTE — Telephone Encounter (Signed)
-----   Message from Theresia Majors, RN sent at 10/29/2019  3:47 PM EDT ----- Tiffany Jones sleep study has been ordered. All information including office note, STOP BANG questions, patient demographics, and orders have been faxed to [facility].

## 2019-10-29 NOTE — Progress Notes (Signed)
Cardiology Office Note    Date:  10/29/2019   ID:  Tiffany KinJackqueline Bezanson, DOB 26-Feb-1966, MRN [id number]  PCP:  Cain SaupeFulp, Cammie, MD  Cardiologist: Armanda Magicraci Flonnie Wierman, MD EPS: None  Chief Complaint  Patient presents with  . Follow-up    Tachycardia, syncope, obesity    History of Present Illness:  Tiffany Jones is a 54 y.o. female with history of asthma, GERD, migraines who was initially seen by me on 04/30/2019 for evaluation of tachycardia.  Since having COVID-19 she had frequent bouts of palpitations and elevated blood pressures.  It was felt that albuterol could be contributing.  She also has a history of syncope with [place] that was normal.  Apparently a tilt table test was recommended but she refused.  She had syncope with Covid.  2D echo showed normal LVEF 60 to 65% with grade 1 DD, GXT myoview scheduled for 09/10/2019, event monitor without arrhythmias but appeared to have transient ST changes therefore stress test was ordered.  Patient was seen in June by Herma CarsonMichelle Lenze, PA and complained of some nausea and was still having palpitations at night and trouble sleeping. She admitted to drinking 1 cup of coffee daily and some sweet tea. It was recommended that she cut out caffeine in her diet.  It was also felt that the syncopal episode she had during COVID was likely vasovagal.    SHe is here today for followup and is doing well.  She is still having problems with the tachycardia but has not cut out caffiene.  She occasionally has some DOE but has morbid obesity.  She denies any chest pain or pressure, PND, orthopnea, LE edema, dizziness  or syncope. She tells me that she snores at night and wakes up snoring.  She does not feel tired when she wakes up and has no daytime sleepiness.  She does wake up from sleep SOB.  She is compliant with her meds and is tolerating meds with no SE.     Past Medical History:  Diagnosis Date  . Asthma   . Enlarged heart   .  Gastroesophageal reflux disease without esophagitis 05/17/2018  . GERD (gastroesophageal reflux disease)   . History of abnormal mammogram   . Migraine without status migrainosus, not intractable 05/17/2018  . Migraines   . Sleep disturbance 05/17/2018    Past Surgical History:  Procedure Laterality Date  . ECTOPIC PREGNANCY SURGERY    . Right breast biopsy      Current Medications: Current Meds  Medication Sig  . albuterol (PROVENTIL) (2.5 MG/3ML) 0.083% nebulizer solution Take 3 mLs (2.5 mg total) by nebulization every 6 (six) hours as needed for wheezing or shortness of breath.  Marland Kitchen. albuterol (VENTOLIN HFA) 108 (90 Base) MCG/ACT inhaler Inhale 2 puffs into the lungs every 6 (six) hours as needed for wheezing or shortness of breath.  . fluticasone (FLOVENT HFA) 110 MCG/ACT inhaler Inhale 2 puffs into the lungs 2 (two) times daily.  Marland Kitchen. gabapentin (NEURONTIN) 100 MG capsule Take 1 capsule (100 mg total) by mouth 2 (two) times daily.  Marland Kitchen. omeprazole (PRILOSEC) 40 MG capsule Take 1 capsule (40 mg total) by mouth daily.  Marland Kitchen. zonisamide (ZONEGRAN) 25 MG capsule Take 1 capsule (25 mg total) by mouth daily.     Allergies:   Sulfa antibiotics   Social History   Socioeconomic History  . Marital status: Widowed    Spouse name: Not on file  . Number of children: Not on  file  . Years of education: Not on file  . Highest education level: Not on file  Occupational History  . Not on file  Tobacco Use  . Smoking status: Never Smoker  . Smokeless tobacco: Never Used  Vaping Use  . Vaping Use: Never used  Substance and Sexual Activity  . Alcohol use: Yes    Comment: socially  . Drug use: Never  . Sexual activity: Not Currently  Other Topics Concern  . Not on file  Social History Narrative  . Not on file   Social Determinants of Health   Financial Resource Strain:   . Difficulty of Paying Living Expenses: Not on file  Food Insecurity:   . Worried About Programme researcher, broadcasting/film/video in the Last  Year: Not on file  . Ran Out of Food in the Last Year: Not on file  Transportation Needs:   . Lack of Transportation (Medical): Not on file  . Lack of Transportation (Non-Medical): Not on file  Physical Activity:   . Days of Exercise per Week: Not on file  . Minutes of Exercise per Session: Not on file  Stress:   . Feeling of Stress : Not on file  Social Connections:   . Frequency of Communication with Friends and Family: Not on file  . Frequency of Social Gatherings with Friends and Family: Not on file  . Attends Religious Services: Not on file  . Active Member of Clubs or Organizations: Not on file  . Attends Banker Meetings: Not on file  . Marital Status: Not on file     Family History:  The patient's family history includes Alcohol abuse in her father; Asthma in her son; Breast cancer (age of onset: 77) in her mother; Hypertension in her father; Lymphoma in her maternal grandfather.   ROS:   Please see the history of present illness.    ROS All other systems reviewed and are negative.   PHYSICAL EXAM:   VS:  BP 120/65   Pulse (!) 53   Ht 5\' 4"  (1.626 m)   Wt 269 lb (122 kg)   LMP 07/18/2017   SpO2 99%   BMI 46.17 kg/m   Physical Exam  GEN: Well nourished, well developed in no acute distress HEENT: Normal NECK: No JVD; No carotid bruits LYMPHATICS: No lymphadenopathy CARDIAC:RRR, no murmurs, rubs, gallops RESPIRATORY:  Clear to auscultation without rales, wheezing or rhonchi  ABDOMEN: Soft, non-tender, non-distended MUSCULOSKELETAL:  No edema; No deformity  SKIN: Warm and dry NEUROLOGIC:  Alert and oriented x 3 PSYCHIATRIC:  Normal affect    Wt Readings from Last 3 Encounters:  10/29/19 269 lb (122 kg)  09/10/19 270 lb (122.5 kg)  09/04/19 270 lb 3.2 oz (122.6 kg)      Studies/Labs Reviewed:   EKG:  EKG is not ordered today.     Recent Labs: No results found for requested labs within last 8760 hours.   Lipid Panel    Component Value  Date/Time   CHOL 230 (H) 09/19/2017 0900   TRIG 81 09/19/2017 0900   HDL 54 09/19/2017 0900   CHOLHDL 4.3 09/19/2017 0900   LDLCALC 160 (H) 09/19/2017 0900    Additional studies/ records that were reviewed today include:  Echo 07/19/2019 IMPRESSIONS     1. Left ventricular ejection fraction, by estimation, is 60 to 65%. The  left ventricle has normal function. The left ventricle has no regional  wall motion abnormalities. Left ventricular diastolic parameters are  consistent  with Grade I diastolic  dysfunction (impaired relaxation).   2. Right ventricular systolic function is normal. The right ventricular  size is normal.   3. The mitral valve is normal in structure. No evidence of mitral valve  regurgitation. No evidence of mitral stenosis.   4. The aortic valve is normal in structure. Aortic valve regurgitation is  not visualized. No aortic stenosis is present.   5. The inferior vena cava is normal in size with greater than 50%  respiratory variability, suggesting right atrial pressure of 3 mmHg.   FINDINGS   Left Ventricle: Left ventricular ejection fraction, by estimation, is 60  to 65%. The left ventricle has normal function. The left ventricle has no  regional wall motion abnormalities. Definity contrast agent was given IV  to delineate the left ventricular   endocardial borders. The left ventricular internal cavity size was normal  in size. There is no left ventricular hypertrophy. Left ventricular  diastolic parameters are consistent with Grade I diastolic dysfunction  (impaired relaxation). Normal left  ventricular filling pressure.   Right Ventricle: The right ventricular size is normal. No increase in  right ventricular wall thickness. Right ventricular systolic function is  normal.   Left Atrium: Left atrial size was normal in size.   Right Atrium: Right atrial size was normal in size.   Pericardium: There is no evidence of pericardial effusion.   Mitral  Valve: The mitral valve is normal in structure. Normal mobility of  the mitral valve leaflets. No evidence of mitral valve regurgitation. No  evidence of mitral valve stenosis.   Tricuspid Valve: The tricuspid valve is normal in structure. Tricuspid  valve regurgitation is not demonstrated. No evidence of tricuspid  stenosis.   Aortic Valve: The aortic valve is normal in structure. Aortic valve  regurgitation is not visualized. No aortic stenosis is present.   Pulmonic Valve: The pulmonic valve was normal in structure. Pulmonic valve  regurgitation is not visualized. No evidence of pulmonic stenosis.   Aorta: The aortic root is normal in size and structure.   Venous: The inferior vena cava is normal in size with greater than 50%  respiratory variability, suggesting right atrial pressure of 3 mmHg.   IAS/Shunts: No atrial level shunt detected by color flow Doppler.    Holter monitor 4/2021Sinus bradycardia, normal sinus rhythm and sinus tachycardia. The average heart rate was 79bpm and ranged from 49-129bpm.  There appears to be transient ST changes on some of the recordings.   ASSESSMENT:    1. Tachycardia   2. Syncope and collapse   3. Morbid obesity (HCC)      PLAN:  In order of problems listed above:  1.  Tachycardia  -there were no significant arrhythmias on Holter monitor but variable ST changes -2D echo with normal LVEF -GXT myoview scheduled for 09/10/2019.   -Holter monitor showed no tachycardia at night and average heart rate in the 80's -I recommended a longer heart monitor but she declined -I will order a home sleep study to assess for sleep apnea  2.  Syncope -sounds vasovagal most recent episode occurred during COVID-19 infection -previous work-up in [place] was normal but she declined what sounds like a tilt table test  3.  Morbid obesity  -I have encouraged her to get into a routine exercise program and cut back on carbs and portions.      Medication Adjustments/Labs and Tests Ordered: Current medicines are reviewed at length with the patient today.  Concerns regarding  medicines are outlined above.  Medication changes, Labs and Tests ordered today are listed in the Patient Instructions below. There are no Patient Instructions on file for this visit.   Signed, Armanda Magic, MD  10/29/2019 3:18 PM    [facility] [address] [place], [place], [place]  [address] Phone: [phone]; Fax: [phone]

## 2019-10-29 NOTE — Telephone Encounter (Addendum)
Patient Name: Tiffany Jones        DOB: 1966/01/13      Height: 5'4"    Weight: 269 lb  Office Name: [facility].          Referring Provider: Dr. Armanda Magic  Today's Date: 10/29/2019   STOP BANG RISK ASSESSMENT S (snore) Have you been told that you snore?     YES   T (tired) Are you often tired, fatigued, or sleepy during the day?   NO  O (obstruction) Do you stop breathing, choke, or gasp during sleep? YES   P (pressure) Do you have or are you being treated for high blood pressure? NO   B (BMI) Is your body index greater than 35 kg/m? YES   A (age) Are you 10 years old or older? YES   N (neck) Do you have a neck circumference greater than 16 inches?   YES   G (gender) Are you a female? NO   TOTAL STOP/BANG "YES" ANSWERS                                                                        For Office Use Only              Procedure Order Form    YES to 3+ Stop Bang questions OR two clinical symptoms - patient qualifies for WatchPAT (CPT 95800)     Submit: This Form + Patient Face Sheet + Clinical Note via CloudPAT or Fax: [phone]         Clinical Notes: Will consult Sleep Specialist and refer for management of therapy due to patient increased risk of Sleep Apnea. Ordering a sleep study due to the following two clinical symptoms: Excessive daytime sleepiness G47.10 / Gastroesophageal reflux K21.9 / Nocturia R35.1 / Morning Headaches G44.221 / Difficulty concentrating R41.840 / Memory problems or poor judgment G31.84 / Personality changes or irritability R45.4 / Loud snoring R06.83 / Depression F32.9 / Unrefreshed by sleep G47.8 / Impotence N52.9 / History of high blood pressure R03.0 / Insomnia G47.00    I understand that I am proceeding with a home sleep apnea test as ordered by my treating physician. I understand that untreated sleep apnea is a serious cardiovascular risk factor and it is my responsibility to perform the test and seek management for  sleep apnea. I will be contacted with the results and be managed for sleep apnea by a local sleep physician. I will be receiving equipment and further instructions from [facility]. I shall promptly ship back the equipment via the included mailing label. I understand my insurance will be billed for the test and as the patient I am responsible for any insurance related out-of-pocket costs incurred. I have been provided with written instructions and can call for additional video or telephonic instruction, with 24-hour availability of qualified personnel to answer any questions: Patient Help Desk [phone].  Patient Telemedicine Verbal Consent

## 2019-11-07 ENCOUNTER — Telehealth: Payer: Self-pay | Admitting: *Deleted

## 2019-11-07 NOTE — Telephone Encounter (Signed)
   Primary Cardiologist: Armanda Magic, MD  Chart reviewed as part of pre-operative protocol coverage. Patient was contacted 11/07/2019 in reference to pre-operative risk assessment for pending surgery as outlined below.  Tiffany Jones was last seen on 10/29/2019 by Dr. Mayford Knife.  Since that day, Tiffany Jones has done well without chest pain or shortness of breath. Recent echocardiogram and nuclear stress test were normal.  Therefore, based on ACC/AHA guidelines, the patient would be at acceptable risk for the planned procedure without further cardiovascular testing.   The patient was advised that if she develops new symptoms prior to surgery to contact our office to arrange for a follow-up visit, and she verbalized understanding.  I will route this recommendation to the requesting party via Epic fax function and remove from pre-op pool. Please call with questions.  [place], [place] 11/07/2019, 5:43 PM

## 2019-11-07 NOTE — Telephone Encounter (Signed)
   [facility] Pre-operative Risk Assessment    HEARTCARE STAFF: - Please ensure there is not already an duplicate clearance open for this procedure. - Under Visit Info/Reason for Call, type in Other and utilize the format Clearance MM/DD/YY or Clearance TBD. Do not use dashes or single digits. - If request is for dental extraction, please clarify the # of teeth to be extracted.  Request for surgical clearance:  1. What type of surgery is being performed? RADIOACTIVE SEED/RIGHT BREAST LUMPECTOMY   2. When is this surgery scheduled? TBD   3. What type of clearance is required (medical clearance vs. Pharmacy clearance to hold med vs. Both)? MEDICAL  4. Are there any medications that need to be held prior to surgery and how long? NONE LISTED   5. Practice name and name of physician performing surgery? [facility]; DR. Coralie Keens   6. What is the office phone number? [phone]   7.   What is the office fax number? [place]: Malachi Bonds, CMA  8.   Anesthesia type (None, local, MAC, general) ? LMA   Julaine Hua 11/07/2019, 11:29 AM  _________________________________________________________________   (provider comments below)

## 2019-11-23 ENCOUNTER — Encounter (HOSPITAL_COMMUNITY): Payer: Self-pay | Admitting: Emergency Medicine

## 2019-11-23 ENCOUNTER — Other Ambulatory Visit: Payer: Self-pay

## 2019-11-23 ENCOUNTER — Ambulatory Visit (HOSPITAL_COMMUNITY)
Admission: EM | Admit: 2019-11-23 | Discharge: 2019-11-23 | Disposition: A | Discharge status: Home / Self Care | Payer: 59 | Attending: Family Medicine | Admitting: Family Medicine

## 2019-11-23 DIAGNOSIS — K219 Gastro-esophageal reflux disease without esophagitis: Secondary | ICD-10-CM | POA: Insufficient documentation

## 2019-11-23 DIAGNOSIS — Z79899 Other long term (current) drug therapy: Secondary | ICD-10-CM | POA: Insufficient documentation

## 2019-11-23 DIAGNOSIS — R05 Cough: Secondary | ICD-10-CM | POA: Diagnosis present

## 2019-11-23 DIAGNOSIS — J45909 Unspecified asthma, uncomplicated: Secondary | ICD-10-CM | POA: Insufficient documentation

## 2019-11-23 DIAGNOSIS — Z20822 Contact with and (suspected) exposure to covid-19: Secondary | ICD-10-CM | POA: Insufficient documentation

## 2019-11-23 DIAGNOSIS — G43909 Migraine, unspecified, not intractable, without status migrainosus: Secondary | ICD-10-CM | POA: Diagnosis not present

## 2019-11-23 DIAGNOSIS — J029 Acute pharyngitis, unspecified: Secondary | ICD-10-CM | POA: Diagnosis not present

## 2019-11-23 DIAGNOSIS — J069 Acute upper respiratory infection, unspecified: Secondary | ICD-10-CM

## 2019-11-23 LAB — SARS CORONAVIRUS 2 (TAT 6-24 HRS): SARS Coronavirus 2: NEGATIVE

## 2019-11-23 MED ORDER — AMOXICILLIN-POT CLAVULANATE 875-125 MG PO TABS: 1.0000 | ORAL_TABLET | Freq: Two times a day (BID) | ORAL | 0 refills | Status: DC

## 2019-11-23 MED ORDER — PROMETHAZINE-DM 6.25-15 MG/5ML PO SYRP
5.0000 mL | ORAL_SOLUTION | Freq: Four times a day (QID) | ORAL | 0 refills | Status: DC | PRN
Start: 2019-11-23 — End: 2020-05-22

## 2019-11-23 MED ORDER — FLUTICASONE PROPIONATE 50 MCG/ACT NA SUSP: 1.0000 | Freq: Every day | NASAL | 0 refills | Status: AC

## 2019-11-23 NOTE — ED Provider Notes (Signed)
[facility]    CSN: [id number] Arrival date & time: 11/23/19  1039      History   Chief Complaint Chief Complaint  Patient presents with   Cough    HPI Tiffany Jones is a 54 y.o. female history of GERD, migraines, asthma, presenting today for evaluation of URI symptoms. Reports has had sore throat cough congestion and drainage. Symptoms began about 6 days ago. Sore throat has improved. Continues to have chills and subjective fevers. Does report son recently sick, does symptoms have resolved, he was not tested. She denies any chest pain or shortness of breath. Using Advil Cold and Sinus as well as leftover Promethazine DM which has provided her significant relief of cough. Denies GI symptoms. Previously had Covid in January 2021 as well as is vaccinated.   HPI  Past Medical History:  Diagnosis Date   Asthma    Enlarged heart    Gastroesophageal reflux disease without esophagitis 05/17/2018   GERD (gastroesophageal reflux disease)    History of abnormal mammogram    Migraine without status migrainosus, not intractable 05/17/2018   Migraines    Sleep disturbance 05/17/2018    Patient Active Problem List   Diagnosis Date Noted   Migraine without status migrainosus, not intractable 05/17/2018   Gastroesophageal reflux disease without esophagitis 05/17/2018   Sleep disturbance 05/17/2018    Past Surgical History:  Procedure Laterality Date   ECTOPIC PREGNANCY SURGERY     Right breast biopsy      OB History   No obstetric history on file.      Home Medications    Prior to Admission medications   Medication Sig Start Date End Date Taking? Authorizing Provider  albuterol (PROVENTIL) (2.5 MG/3ML) 0.083% nebulizer solution Take 3 mLs (2.5 mg total) by nebulization every 6 (six) hours as needed for wheezing or shortness of breath. 06/10/19   Rema Fendt, NP  albuterol (VENTOLIN HFA) 108 (90 Base) MCG/ACT inhaler Inhale 2 puffs into the lungs  every 6 (six) hours as needed for wheezing or shortness of breath. 06/10/19   Rema Fendt, NP  amoxicillin-clavulanate (AUGMENTIN) 875-125 MG tablet Take 1 tablet by mouth every 12 (twelve) hours. 11/19/19   Kaity Pitstick C, PA-C  fluticasone (FLONASE) 50 MCG/ACT nasal spray Place 1-2 sprays into both nostrils daily for 7 days. 11/23/19 11/30/19  Teala Daffron C, PA-C  fluticasone (FLOVENT HFA) 110 MCG/ACT inhaler Inhale 2 puffs into the lungs 2 (two) times daily. 07/17/19   Steffanie Dunn, DO  gabapentin (NEURONTIN) 100 MG capsule Take 1 capsule (100 mg total) by mouth 2 (two) times daily. 02/15/19   Fulp, Cammie, MD  omeprazole (PRILOSEC) 40 MG capsule Take 1 capsule (40 mg total) by mouth daily. 10/10/19   Fulp, Cammie, MD  promethazine-dextromethorphan (PROMETHAZINE-DM) 6.25-15 MG/5ML syrup Take 5 mLs by mouth 4 (four) times daily as needed for cough. 11/23/19   Dreden Rivere C, PA-C  zonisamide (ZONEGRAN) 25 MG capsule Take 1 capsule (25 mg total) by mouth daily. 02/15/19   Cain Saupe, MD    Family History Family History  Problem Relation Age of Onset   Breast cancer Mother 55   Hypertension Father    Alcohol abuse Father    Lymphoma Maternal Grandfather    Asthma Son     Social History Social History   Tobacco Use   Smoking status: Never Smoker   Smokeless tobacco: Never Used  Building services engineer Use: Never used  Substance Use  Topics   Alcohol use: Yes    Comment: socially   Drug use: Never     Allergies   Sulfa antibiotics   Review of Systems Review of Systems  Constitutional: Positive for chills. Negative for activity change, appetite change, fatigue and fever.  HENT: Positive for congestion, rhinorrhea and sore throat. Negative for ear pain, sinus pressure and trouble swallowing.   Eyes: Negative for discharge and redness.  Respiratory: Positive for cough. Negative for chest tightness and shortness of breath.   Cardiovascular: Negative for chest  pain.  Gastrointestinal: Negative for abdominal pain, diarrhea, nausea and vomiting.  Musculoskeletal: Negative for myalgias.  Skin: Negative for rash.  Neurological: Negative for dizziness, light-headedness and headaches.     Physical Exam Triage Vital Signs ED Triage Vitals  Enc Vitals Group     BP 11/23/19 1129 117/76     Pulse Rate 11/23/19 1129 79     Resp 11/23/19 1129 18     Temp 11/23/19 1129 98.8 F (37.1 C)     Temp Source 11/23/19 1129 Oral     SpO2 11/23/19 1129 99 %     Weight --      Height --      Head Circumference --      Peak Flow --      Pain Score 11/23/19 1128 0     Pain Loc --      Pain Edu? --      Excl. in GC? --    No data found.  Updated Vital Signs BP 117/76 (BP Location: Left Arm)    Pulse 79    Temp 98.8 F (37.1 C) (Oral)    Resp 18    LMP 07/18/2017    SpO2 99%   Visual Acuity Right Eye Distance:   Left Eye Distance:   Bilateral Distance:    Right Eye Near:   Left Eye Near:    Bilateral Near:     Physical Exam Vitals and nursing note reviewed.  Constitutional:      Appearance: She is well-developed.     Comments: No acute distress  HENT:     Head: Normocephalic and atraumatic.     Ears:     Comments: Bilateral ears without tenderness to palpation of external auricle, tragus and mastoid, EAC's without erythema or swelling, TM's with good bony landmarks and cone of light. Non erythematous.     Nose: Nose normal.     Mouth/Throat:     Comments: Oral mucosa pink and moist, no tonsillar enlargement or exudate. Posterior pharynx patent and nonerythematous, no uvula deviation or swelling. Normal phonation. Eyes:     Conjunctiva/sclera: Conjunctivae normal.  Cardiovascular:     Rate and Rhythm: Normal rate and regular rhythm.  Pulmonary:     Effort: Pulmonary effort is normal. No respiratory distress.     Comments: Breathing comfortably at rest, CTABL, no wheezing, rales or other adventitious sounds auscultated Abdominal:      General: There is no distension.  Musculoskeletal:        General: Normal range of motion.     Cervical back: Neck supple.  Skin:    General: Skin is warm and dry.  Neurological:     Mental Status: She is alert and oriented to person, place, and time.      UC Treatments / Results  Labs (all labs ordered are listed, but only abnormal results are displayed) Labs Reviewed  SARS CORONAVIRUS 2 (TAT 6-24 HRS)    EKG  Radiology No results found.  Procedures Procedures (including critical care time)  Medications Ordered in UC Medications - No data to display  Initial Impression / Assessment and Plan / UC Course  I have reviewed the triage vital signs and the nursing notes.  Pertinent labs & imaging results that were available during my care of the patient were reviewed by me and considered in my medical decision making (see chart for details).     Suspect likely viral URI, Covid PCR pending for screening, recommending continued symptomatic and supportive care over the next 3 to 4 days, did provide prescription for Augmentin to treat sinus infection to pick up in 3 days if not having any improvement with continued rest fluids and adding again Flonase and daily antihistamine for congestion and drainage. Promethazine DM refilled.  Discussed strict return precautions. Patient verbalized understanding and is agreeable with plan.  Final Clinical Impressions(s) / UC Diagnoses   Final diagnoses:  Viral URI with cough     Discharge Instructions     COVID test pending- monitor mychart Claritin, flonase for congestion Refilled promethazine Dm cough syrup Rest and fluids Tylneol and ibuprofen as needed Augmentin to pick up in 3 days if not improving to treat sinus infection Follow up if not improving or worsening    ED Prescriptions    Medication Sig Dispense Auth. Provider   promethazine-dextromethorphan (PROMETHAZINE-DM) 6.25-15 MG/5ML syrup Take 5 mLs by mouth 4 (four)  times daily as needed for cough. 240 mL Joe Tanney C, PA-C   fluticasone (FLONASE) 50 MCG/ACT nasal spray Place 1-2 sprays into both nostrils daily for 7 days. 1 g Rylinn Linzy C, PA-C   amoxicillin-clavulanate (AUGMENTIN) 875-125 MG tablet Take 1 tablet by mouth every 12 (twelve) hours. 14 tablet Jakyri Brunkhorst, Shiro C, PA-C     PDMP not reviewed this encounter.   Lew Dawes, PA-C 11/23/19 1204

## 2019-11-23 NOTE — ED Triage Notes (Signed)
Pt presents with scratchy throat, head congestion, fever and chills, coughing xs 5 days.

## 2019-11-23 NOTE — Discharge Instructions (Signed)
COVID test pending- monitor mychart Claritin, flonase for congestion Refilled promethazine Dm cough syrup Rest and fluids Tylneol and ibuprofen as needed Augmentin to pick up in 3 days if not improving to treat sinus infection Follow up if not improving or worsening

## 2019-12-03 ENCOUNTER — Other Ambulatory Visit: Payer: Self-pay | Admitting: Family Medicine

## 2019-12-03 DIAGNOSIS — Z8611 Personal history of tuberculosis: Secondary | ICD-10-CM

## 2019-12-03 DIAGNOSIS — R058 Other specified cough: Secondary | ICD-10-CM

## 2019-12-03 NOTE — Progress Notes (Signed)
Patient ID: Tiffany Jones, female   DOB: May 05, 1965, 54 y.o.   MRN: [id number]   Patient reports that she has had a positive Interferon test at occupational health/health at work- results are not in patient chart. She is requesting a CXR and wants to come into the office for a visit. She reports cough greater than two weeks and past TB infection. On review of chart, she has had urgent care visit on 11/23/2019 and prescribed Augmentin 875 and cough medication for viral URI. She reports that she received positive Interferon results on 11/29/2019. Order will be placed for CXR but patient will be notified that her appointment will be virtual and attempt will be made to obtain Interferon results but that patient also needs referral to the health department if her test was positive and she had a prior history of TB as CXR can still be negative in 85% of latent TB cases per Up To Date.

## 2019-12-04 ENCOUNTER — Encounter: Payer: Self-pay | Admitting: Family Medicine

## 2019-12-04 ENCOUNTER — Ambulatory Visit (HOSPITAL_COMMUNITY)
Admission: RE | Admit: 2019-12-04 | Discharge: 2019-12-04 | Disposition: A | Discharge status: Home / Self Care | Payer: 59 | Source: Ambulatory Visit | Attending: Family Medicine | Admitting: Family Medicine

## 2019-12-04 ENCOUNTER — Ambulatory Visit: Payer: 59 | Attending: Family Medicine | Admitting: Family Medicine

## 2019-12-04 ENCOUNTER — Other Ambulatory Visit: Payer: Self-pay

## 2019-12-04 DIAGNOSIS — Z8611 Personal history of tuberculosis: Secondary | ICD-10-CM | POA: Diagnosis present

## 2019-12-04 DIAGNOSIS — R058 Other specified cough: Secondary | ICD-10-CM

## 2019-12-04 DIAGNOSIS — R05 Cough: Secondary | ICD-10-CM | POA: Diagnosis present

## 2019-12-04 DIAGNOSIS — R053 Chronic cough: Secondary | ICD-10-CM

## 2019-12-04 DIAGNOSIS — K219 Gastro-esophageal reflux disease without esophagitis: Secondary | ICD-10-CM

## 2019-12-04 DIAGNOSIS — J4540 Moderate persistent asthma, uncomplicated: Secondary | ICD-10-CM

## 2019-12-04 MED ORDER — OMEPRAZOLE 40 MG PO CPDR: 40.0000 mg | DELAYED_RELEASE_CAPSULE | Freq: Every day | ORAL | 5 refills | Status: DC

## 2019-12-04 NOTE — Progress Notes (Signed)
Virtual Visit via Telephone Note  I connected with Sherlon Fellman on 12/04/19 at  8:30 AM EDT by telephone and verified that I am speaking with the correct person using two identifiers.   I discussed the limitations, risks, security and privacy concerns of performing an evaluation and management service by telephone and the availability of in person appointments. I also discussed with the patient that there may be a patient responsible charge related to this service. The patient expressed understanding and agreed to proceed.  Patient Location: Home Provider Location: [facility] Others participating in call: none   History of Present Illness:       54 yo female with moderate persistent asthma and GERD who reports persistent cough.  She believes that her cough worsened after she had COVID-19 last year.  She does not feel as if she is having wheezing at this time but she cannot always tell when she is having wheezing.  She was seen at [facility] urgent care on 11/23/2019 due to her persistent cough and was placed on Augmentin and cough medication.  She reports that she continues to have a cough throughout the day and cough occasionally awakens her at night.  She also reports that she went to occupational health for evaluation for a job and was told on 11/29/2019 that she had a positive interferon test.  She reports that she was referred to the health department but has been unable to contact anyone there.  She does report exposure to tuberculosis in the 1970s as her grandmother had tuberculosis and she and her brother were treated with medication x1 year.  She states that she was told to never have the skin test done because it would always be positive and cause damage to her skin.  She wonders if the interferon test was positive because of her prior tuberculosis exposure.  She denies any issues with unexplained weight loss, no bloody sputum/coughing up blood but she does have night sweats which she  attributes to menopause as she states that the night sweats have been going on for few years.        She also needs a refill of omeprazole for continued acid reflux.  She reports that her current dose provides good symptom relief.  She denies any current abdominal pain or reflux symptoms.   Past Medical History:  Diagnosis Date  . Asthma   . Enlarged heart   . Gastroesophageal reflux disease without esophagitis 05/17/2018  . GERD (gastroesophageal reflux disease)   . History of abnormal mammogram   . Migraine without status migrainosus, not intractable 05/17/2018  . Migraines   . Sleep disturbance 05/17/2018    Past Surgical History:  Procedure Laterality Date  . ECTOPIC PREGNANCY SURGERY    . Right breast biopsy      Family History  Problem Relation Age of Onset  . Breast cancer Mother 54  . Hypertension Father   . Alcohol abuse Father   . Lymphoma Maternal Grandfather   . Asthma Son     Social History   Tobacco Use  . Smoking status: Never Smoker  . Smokeless tobacco: Never Used  Vaping Use  . Vaping Use: Never used  Substance Use Topics  . Alcohol use: Yes    Comment: socially  . Drug use: Never     Allergies  Allergen Reactions  . Sulfa Antibiotics Hives       Observations/Objective: No vital signs or physical exam conducted as visit was done via telephone  Slightly hoarse speaking voice.  Did not have to stop speaking due to shortness of breath.  No recurrent cough during phone conversation.  Assessment and Plan: 1. Persistent cough for 3 weeks or longer Order has been placed for patient to have a chest x-ray of which patient was made aware.  Patient had requested a chest x-ray when the nurse spoke with her yesterday.  Patient is encouraged to go by the health department if she is unable to get anyone on the phone so that she can have follow-up of the positive interferon test.  She may also wish to contact her pulmonologist regarding her recent positive  interferon test and continued cough.  2. Gastroesophageal reflux disease without esophagitis Refill provided for omeprazole for continued treatment of acid reflux which at this time is stable and controlled per patient. - omeprazole (PRILOSEC) 40 MG capsule; Take 1 capsule (40 mg total) by mouth daily. To decrease stomach acid  Dispense: 30 capsule; Refill: 5  3. Moderate persistent asthma, unspecified whether complicated Continue daily use of respiratory medications for treatment of asthma as prescribed by pulmonology with as needed use of albuterol for cough, wheezing or shortness of breath.  Follow Up Instructions:Return in about 4 months (around 04/04/2020) for chronic issues;cough- f/u with health dept/pulmonology.    I discussed the assessment and treatment plan with the patient. The patient was provided an opportunity to ask questions and all were answered. The patient agreed with the plan and demonstrated an understanding of the instructions.   The patient was advised to call back or seek an in-person evaluation if the symptoms worsen or if the condition fails to improve as anticipated.  I provided 16 minutes of non-face-to-face time during this encounter.   Cain Saupe, MD

## 2019-12-04 NOTE — Progress Notes (Signed)
Needs omeprazole refill   

## 2019-12-06 ENCOUNTER — Telehealth: Payer: Self-pay

## 2019-12-06 NOTE — Telephone Encounter (Signed)
-----   Message from Eloise Levels, RN sent at 12/05/2019  4:21 PM EDT ----- Regarding: TB follow-up Follow-up w/ pt and see if Tammy, TB Coordinator w/ [facility] reached out to her for scheduling.

## 2019-12-06 NOTE — Progress Notes (Signed)
Normal result letter sent via MyChart 

## 2019-12-06 NOTE — Telephone Encounter (Signed)
Spoke w/ Tammy, TB coordinator w/ [facility], pt has appt with them today for follow-up.

## 2019-12-28 ENCOUNTER — Other Ambulatory Visit: Payer: Self-pay

## 2019-12-28 ENCOUNTER — Emergency Department (HOSPITAL_COMMUNITY)
Admission: EM | Admit: 2019-12-28 | Discharge: 2019-12-28 | Disposition: A | Discharge status: Home / Self Care | Payer: 59 | Attending: Emergency Medicine | Admitting: Emergency Medicine

## 2019-12-28 ENCOUNTER — Encounter (HOSPITAL_COMMUNITY): Payer: Self-pay | Admitting: Emergency Medicine

## 2019-12-28 ENCOUNTER — Emergency Department (HOSPITAL_COMMUNITY): Payer: 59

## 2019-12-28 DIAGNOSIS — R509 Fever, unspecified: Secondary | ICD-10-CM | POA: Diagnosis present

## 2019-12-28 DIAGNOSIS — R519 Headache, unspecified: Secondary | ICD-10-CM | POA: Diagnosis not present

## 2019-12-28 DIAGNOSIS — N390 Urinary tract infection, site not specified: Secondary | ICD-10-CM | POA: Insufficient documentation

## 2019-12-28 DIAGNOSIS — J45909 Unspecified asthma, uncomplicated: Secondary | ICD-10-CM | POA: Diagnosis not present

## 2019-12-28 DIAGNOSIS — M545 Low back pain, unspecified: Secondary | ICD-10-CM | POA: Diagnosis not present

## 2019-12-28 DIAGNOSIS — Z7951 Long term (current) use of inhaled steroids: Secondary | ICD-10-CM | POA: Insufficient documentation

## 2019-12-28 LAB — CBC WITH DIFFERENTIAL/PLATELET
Abs Immature Granulocytes: 0.04 10*3/uL (ref 0.00–0.07)
Basophils Absolute: 0 10*3/uL (ref 0.0–0.1)
Basophils Relative: 0 %
Eosinophils Absolute: 0 10*3/uL (ref 0.0–0.5)
Eosinophils Relative: 0 %
HCT: 38.2 % (ref 36.0–46.0)
Hemoglobin: 11.8 g/dL — ABNORMAL LOW (ref 12.0–15.0)
Immature Granulocytes: 0 %
Lymphocytes Relative: 10 %
Lymphs Abs: 1 10*3/uL (ref 0.7–4.0)
MCH: 28.8 pg (ref 26.0–34.0)
MCHC: 30.9 g/dL (ref 30.0–36.0)
MCV: 93.2 fL (ref 80.0–100.0)
Monocytes Absolute: 0.5 10*3/uL (ref 0.1–1.0)
Monocytes Relative: 5 %
Neutro Abs: 8.9 10*3/uL — ABNORMAL HIGH (ref 1.7–7.7)
Neutrophils Relative %: 85 %
Platelets: 277 10*3/uL (ref 150–400)
RBC: 4.1 MIL/uL (ref 3.87–5.11)
RDW: 12.6 % (ref 11.5–15.5)
WBC: 10.5 10*3/uL (ref 4.0–10.5)
nRBC: 0 % (ref 0.0–0.2)

## 2019-12-28 LAB — URINALYSIS, ROUTINE W REFLEX MICROSCOPIC
Bilirubin Urine: NEGATIVE
Glucose, UA: NEGATIVE mg/dL
Hgb urine dipstick: NEGATIVE
Ketones, ur: 5 mg/dL — AB
Nitrite: NEGATIVE
Protein, ur: NEGATIVE mg/dL
Specific Gravity, Urine: 1.01 (ref 1.005–1.030)
pH: 6 (ref 5.0–8.0)

## 2019-12-28 LAB — COMPREHENSIVE METABOLIC PANEL
ALT: 29 U/L (ref 0–44)
AST: 38 U/L (ref 15–41)
Albumin: 3.5 g/dL (ref 3.5–5.0)
Alkaline Phosphatase: 66 U/L (ref 38–126)
Anion gap: 8 (ref 5–15)
BUN: 9 mg/dL (ref 6–20)
CO2: 22 mmol/L (ref 22–32)
Calcium: 9.4 mg/dL (ref 8.9–10.3)
Chloride: 104 mmol/L (ref 98–111)
Creatinine, Ser: 0.94 mg/dL (ref 0.44–1.00)
GFR, Estimated: >60 mL/min (ref >60)
Glucose, Bld: 114 mg/dL — ABNORMAL HIGH (ref 70–99)
Potassium: 3.9 mmol/L (ref 3.5–5.1)
Sodium: 134 mmol/L — ABNORMAL LOW (ref 135–145)
Total Bilirubin: 1 mg/dL (ref 0.3–1.2)
Total Protein: 7.8 g/dL (ref 6.5–8.1)

## 2019-12-28 LAB — LACTIC ACID, PLASMA: Lactic Acid, Venous: 0.6 mmol/L (ref 0.5–1.9)

## 2019-12-28 MED ORDER — CEPHALEXIN 250 MG PO CAPS
500.0000 mg | ORAL_CAPSULE | Freq: Once | ORAL | Status: AC
  Administered 2019-12-28: 500 mg via ORAL
  Filled 2019-12-28: qty 2

## 2019-12-28 MED ORDER — CEPHALEXIN 500 MG PO CAPS: 500.0000 mg | ORAL_CAPSULE | Freq: Three times a day (TID) | ORAL | 0 refills | Status: DC

## 2019-12-28 NOTE — ED Provider Notes (Signed)
[facility] EMERGENCY DEPARTMENT Provider Note   CSN: [id number] Arrival date & time: 12/28/19  1245     History Chief Complaint  Patient presents with  . Fever  . Back Pain    Tiffany Jones is a 54 y.o. female.  The history is provided by the patient and medical records.  Fever Back Pain Associated symptoms: fever    Tiffany Jones is a 54 y.o. female who presents to the Emergency Department complaining of fever.  She presents to the ED complaining of subjective fever for two days.  She has associated chills.  She has chronic cough, unchanged from baseline. She has slight headache, she has some left sided low back tightness.  Denies chest pain, abdominal pain. She had nausea this morning.  She is urinating more than usual but no dysuria.  No known sick contacts.  Has a hx/o asthma, prediabetes, migraines. No hormone use, no hx/o DVT/PE.  Has been vaccinated for COVID 19 in June.    She was treated with Augmentin in September for URI.      Past Medical History:  Diagnosis Date  . Asthma   . Enlarged heart   . Gastroesophageal reflux disease without esophagitis 05/17/2018  . GERD (gastroesophageal reflux disease)   . History of abnormal mammogram   . Migraine without status migrainosus, not intractable 05/17/2018  . Migraines   . Sleep disturbance 05/17/2018    Patient Active Problem List   Diagnosis Date Noted  . Migraine without status migrainosus, not intractable 05/17/2018  . Gastroesophageal reflux disease without esophagitis 05/17/2018  . Sleep disturbance 05/17/2018    Past Surgical History:  Procedure Laterality Date  . ECTOPIC PREGNANCY SURGERY    . Right breast biopsy       OB History   No obstetric history on file.     Family History  Problem Relation Age of Onset  . Breast cancer Mother 85  . Hypertension Father   . Alcohol abuse Father   . Lymphoma Maternal Grandfather   . Asthma Son     Social History    Tobacco Use  . Smoking status: Never Smoker  . Smokeless tobacco: Never Used  Vaping Use  . Vaping Use: Never used  Substance Use Topics  . Alcohol use: Yes    Comment: socially  . Drug use: Never    Home Medications Prior to Admission medications   Medication Sig Start Date End Date Taking? Authorizing Provider  albuterol (PROVENTIL) (2.5 MG/3ML) 0.083% nebulizer solution Take 3 mLs (2.5 mg total) by nebulization every 6 (six) hours as needed for wheezing or shortness of breath. 06/10/19   Rema Fendt, NP  albuterol (VENTOLIN HFA) 108 (90 Base) MCG/ACT inhaler Inhale 2 puffs into the lungs every 6 (six) hours as needed for wheezing or shortness of breath. 06/10/19   Rema Fendt, NP  cephALEXin (KEFLEX) 500 MG capsule Take 1 capsule (500 mg total) by mouth 3 (three) times daily. 12/28/19   Tilden Fossa, MD  fluticasone [facility]) 50 MCG/ACT nasal spray Place 1-2 sprays into both nostrils daily for 7 days. 11/23/19 11/30/19  Wieters, Hallie C, PA-C  fluticasone (FLOVENT HFA) 110 MCG/ACT inhaler Inhale 2 puffs into the lungs 2 (two) times daily. 07/17/19   Steffanie Dunn, DO  gabapentin (NEURONTIN) 100 MG capsule Take 1 capsule (100 mg total) by mouth 2 (two) times daily. 02/15/19   Fulp, Cammie, MD  omeprazole (PRILOSEC) 40 MG capsule Take 1 capsule (40 mg total)  by mouth daily. To decrease stomach acid 12/04/19   Fulp, Cammie, MD  promethazine-dextromethorphan (PROMETHAZINE-DM) 6.25-15 MG/5ML syrup Take 5 mLs by mouth 4 (four) times daily as needed for cough. 11/23/19   Wieters, Hallie C, PA-C  zonisamide (ZONEGRAN) 25 MG capsule Take 1 capsule (25 mg total) by mouth daily. 02/15/19   Fulp, Hewitt Shorts, MD    Allergies    Sulfa antibiotics  Review of Systems   Review of Systems  Constitutional: Positive for fever.  Musculoskeletal: Positive for back pain.  All other systems reviewed and are negative.   Physical Exam Updated Vital Signs BP 126/76 (BP Location: Right Arm)   Pulse  95   Temp 98.7 F (37.1 C) (Oral)   Resp 20   Ht 5\' 4"  (1.626 m)   Wt 123.8 kg   LMP 07/18/2017   SpO2 97%   BMI 46.86 kg/m   Physical Exam Vitals and nursing note reviewed.  Constitutional:      Appearance: She is well-developed.  HENT:     Head: Normocephalic and atraumatic.  Cardiovascular:     Rate and Rhythm: Normal rate and regular rhythm.     Heart sounds: No murmur heard.   Pulmonary:     Effort: Pulmonary effort is normal. No respiratory distress.     Breath sounds: Normal breath sounds.  Abdominal:     Palpations: Abdomen is soft.     Tenderness: There is no abdominal tenderness. There is no right CVA tenderness, left CVA tenderness, guarding or rebound.  Musculoskeletal:        General: No swelling or tenderness.  Skin:    General: Skin is warm and dry.  Neurological:     Mental Status: She is alert and oriented to person, place, and time.  Psychiatric:        Behavior: Behavior normal.     ED Results / Procedures / Treatments   Labs (all labs ordered are listed, but only abnormal results are displayed) Labs Reviewed  COMPREHENSIVE METABOLIC PANEL - Abnormal; Notable for the following components:      Result Value   Sodium 134 (*)    Glucose, Bld 114 (*)    All other components within normal limits  CBC WITH DIFFERENTIAL/PLATELET - Abnormal; Notable for the following components:   Hemoglobin 11.8 (*)    Neutro Abs 8.9 (*)    All other components within normal limits  URINALYSIS, ROUTINE W REFLEX MICROSCOPIC - Abnormal; Notable for the following components:   APPearance HAZY (*)    Ketones, ur 5 (*)    Leukocytes,Ua LARGE (*)    Bacteria, UA RARE (*)    All other components within normal limits  URINE CULTURE  LACTIC ACID, PLASMA    EKG None  Radiology DG Chest Port 1 View  Result Date: 12/28/2019 CLINICAL DATA:  Fever. EXAM: PORTABLE CHEST 1 VIEW COMPARISON:  December 04, 2019. FINDINGS: The heart size and mediastinal contours are within  normal limits. Both lungs are clear. The visualized skeletal structures are unremarkable. IMPRESSION: No active disease. Electronically Signed   By: December 06, 2019 M.D.   On: 12/28/2019 11:33    Procedures Procedures (including critical care time)  Medications Ordered in ED Medications  cephALEXin (KEFLEX) capsule 500 mg (has no administration in time range)    ED Course  I have reviewed the triage vital signs and the nursing notes.  Pertinent labs & imaging results that were available during my care of the patient were reviewed by  me and considered in my medical decision making (see chart for details).    MDM Rules/Calculators/A&P                         Patient here for evaluation of subjective fevers, flank pain and increased urination. She is non-toxic appearing on evaluation. UA is concerning for UTI in the setting of her symptoms. Chest x-ray personally reviewed, no evidence of pneumonia. Presentation is not consistent with epidural abscess, infected kidney stone, sepsis. Discussed with patient home care for pyelonephritis. Discussed outpatient follow-up as well as return precautions. Final Clinical Impression(s) / ED Diagnoses Final diagnoses:  Acute UTI    Rx / DC Orders ED Discharge Orders         Ordered    cephALEXin (KEFLEX) 500 MG capsule  3 times daily        12/28/19 1224           Tilden Fossa, MD 12/28/19 1225

## 2019-12-28 NOTE — ED Triage Notes (Signed)
Pt; has had COVID vacine.

## 2019-12-28 NOTE — ED Triage Notes (Signed)
Pt. Stated, Tiffany Jones had a fever since Thursday with back pain.

## 2019-12-29 LAB — URINE CULTURE

## 2020-01-18 ENCOUNTER — Other Ambulatory Visit: Payer: Self-pay | Admitting: Family Medicine

## 2020-01-18 DIAGNOSIS — G43C0 Periodic headache syndromes in child or adult, not intractable: Secondary | ICD-10-CM

## 2020-01-18 NOTE — Telephone Encounter (Signed)
Requested medication (s) are due for refill today: yes  Requested medication (s) are on the active medication list: yes  Last refill:  02/15/19  Future visit scheduled: no  Notes to clinic:  med not delegated to NT to RF   Requested Prescriptions  Pending Prescriptions Disp Refills   zonisamide (ZONEGRAN) 25 MG capsule [Pharmacy Med Name: Zonisamide 25 MG Oral Capsule] 30 capsule 0    Sig: Take 1 capsule by mouth once daily      Not Delegated - Neurology: Anticonvulsants - topiramate & zonisamide Failed - 01/18/2020  8:20 AM      Failed - This refill cannot be delegated      Passed - Cr in normal range and within 360 days    Creatinine, Ser  Date Value Ref Range Status  12/28/2019 0.94 0.44 - 1.00 mg/dL Final          Passed - CO2 in normal range and within 360 days    CO2  Date Value Ref Range Status  12/28/2019 22 22 - 32 mmol/L Final          Passed - Valid encounter within last 12 months    Recent Outpatient Visits           1 month ago Persistent cough for 3 weeks or longer   [facility] Fulp, [place], MD   7 months ago Mild intermittent asthma without complication   [place] [place], Amy J, NP   9 months ago Moderate asthma with exacerbation, unspecified whether persistent   [place] [facility] Fulp, [place], MD   10 months ago Hypertension, unspecified type   [facility] [place], [place], NP   11 months ago History of abnormal mammogram   [facility] [place], [place], MD

## 2020-02-24 ENCOUNTER — Other Ambulatory Visit: Payer: Self-pay | Admitting: Family Medicine

## 2020-02-24 DIAGNOSIS — G43C0 Periodic headache syndromes in child or adult, not intractable: Secondary | ICD-10-CM

## 2020-02-24 DIAGNOSIS — K219 Gastro-esophageal reflux disease without esophagitis: Secondary | ICD-10-CM

## 2020-02-24 MED ORDER — OMEPRAZOLE 40 MG PO CPDR: 40.0000 mg | DELAYED_RELEASE_CAPSULE | Freq: Every day | ORAL | 0 refills | Status: DC

## 2020-02-24 MED ORDER — ZONISAMIDE 25 MG PO CAPS: 25.0000 mg | ORAL_CAPSULE | Freq: Every day | ORAL | 0 refills | Status: DC

## 2020-02-24 NOTE — Telephone Encounter (Signed)
   Notes to clinic:  Medication last filled by Cammie Fulp Review for refill    Requested Prescriptions  Pending Prescriptions Disp Refills   omeprazole (PRILOSEC) 40 MG capsule 30 capsule 5    Sig: Take 1 capsule (40 mg total) by mouth daily. To decrease stomach acid      Gastroenterology: Proton Pump Inhibitors Passed - 02/24/2020  2:57 PM      Passed - Valid encounter within last 12 months    Recent Outpatient Visits           2 months ago Persistent cough for 3 weeks or longer   [facility] Fulp, [place], MD   8 months ago Mild intermittent asthma without complication   [place] [facility] [place], Amy J, NP   10 months ago Moderate asthma with exacerbation, unspecified whether persistent   [facility] [place], [place], MD   11 months ago Hypertension, unspecified type   [facility] [place], [place] J, NP   1 year ago History of abnormal mammogram   [facility] Fulp, [place], MD                  zonisamide (ZONEGRAN) 25 MG capsule 30 capsule 0    Sig: Take 1 capsule (25 mg total) by mouth daily.      Not Delegated - Neurology: Anticonvulsants - topiramate & zonisamide Failed - 02/24/2020  2:57 PM      Failed - This refill cannot be delegated      Passed - Cr in normal range and within 360 days    Creatinine, Ser  Date Value Ref Range Status  12/28/2019 0.94 0.44 - 1.00 mg/dL Final          Passed - CO2 in normal range and within 360 days    CO2  Date Value Ref Range Status  12/28/2019 22 22 - 32 mmol/L Final          Passed - Valid encounter within last 12 months    Recent Outpatient Visits           2 months ago Persistent cough for 3 weeks or longer   [facility] [place], [place], MD   8 months ago Mild intermittent asthma without complication   [place] [facility]  [place], Amy J, NP   10 months ago Moderate asthma with exacerbation, unspecified whether persistent   [facility] Fulp, [place], MD   11 months ago Hypertension, unspecified type   [facility] [place], [place], NP   1 year ago History of abnormal mammogram   [facility] [place], [place], MD

## 2020-02-24 NOTE — Telephone Encounter (Signed)
Medication:  omeprazole (PRILOSEC) 40 MG capsule [[id number]]  zonisamide (ZONEGRAN) 25 MG capsule [[id number]]   Has the patient contacted their pharmacy? No  (Agent: If no, request  that the patient contact the pharmacy for the refill.)   Preferred Pharmacy (with phone number or street name):  [facility] - [place]), [place] - [address]  [address] [place]) [place] [address]  Phone:  [phone] Fax:  [phone]  Agent: Please be advised that RX refills may take up to 3 business days. We ask that you follow-up with your pharmacy.

## 2020-02-26 ENCOUNTER — Other Ambulatory Visit: Payer: Self-pay | Admitting: Pharmacist

## 2020-02-26 DIAGNOSIS — G43C0 Periodic headache syndromes in child or adult, not intractable: Secondary | ICD-10-CM

## 2020-02-26 DIAGNOSIS — K219 Gastro-esophageal reflux disease without esophagitis: Secondary | ICD-10-CM

## 2020-02-26 MED ORDER — ZONISAMIDE 25 MG PO CAPS: 25.0000 mg | ORAL_CAPSULE | Freq: Every day | ORAL | 0 refills | Status: DC

## 2020-02-26 MED ORDER — OMEPRAZOLE 40 MG PO CPDR: 40.0000 mg | DELAYED_RELEASE_CAPSULE | Freq: Every day | ORAL | 0 refills | Status: DC

## 2020-04-01 ENCOUNTER — Other Ambulatory Visit: Payer: Self-pay | Admitting: Family Medicine

## 2020-04-01 DIAGNOSIS — G43C0 Periodic headache syndromes in child or adult, not intractable: Secondary | ICD-10-CM

## 2020-04-02 NOTE — Telephone Encounter (Signed)
Requested medication (s) are due for refill today: yes  Requested medication (s) are on the active medication list: yes  Last refill:  02/26/20  #30  0 refills  Future visit scheduled:No  Notes to clinic: Not delegated    Requested Prescriptions  Pending Prescriptions Disp Refills   zonisamide (ZONEGRAN) 25 MG capsule [Pharmacy Med Name: Zonisamide 25 MG Oral Capsule] 30 capsule 0    Sig: Take 1 capsule by mouth once daily      Not Delegated - Neurology: Anticonvulsants - topiramate & zonisamide Failed - 04/01/2020  5:16 PM      Failed - This refill cannot be delegated      Passed - Cr in normal range and within 360 days    Creatinine, Ser  Date Value Ref Range Status  12/28/2019 0.94 0.44 - 1.00 mg/dL Final          Passed - CO2 in normal range and within 360 days    CO2  Date Value Ref Range Status  12/28/2019 22 22 - 32 mmol/L Final          Passed - Valid encounter within last 12 months    Recent Outpatient Visits           4 months ago Persistent cough for 3 weeks or longer   [place] [facility] Fulp, [place], MD   9 months ago Mild intermittent asthma without complication   [place] [facility] [place], Amy J, NP   11 months ago Moderate asthma with exacerbation, unspecified whether persistent   [facility] Fulp, [place], MD   1 year ago Hypertension, unspecified type   [facility] [place], [place], NP   1 year ago History of abnormal mammogram   [facility] [place] Saupe, MD

## 2020-05-01 ENCOUNTER — Other Ambulatory Visit: Payer: Self-pay | Admitting: Surgery

## 2020-05-01 DIAGNOSIS — N6489 Other specified disorders of breast: Secondary | ICD-10-CM

## 2020-05-06 ENCOUNTER — Telehealth: Payer: Self-pay | Admitting: Family Medicine

## 2020-05-06 DIAGNOSIS — G43C0 Periodic headache syndromes in child or adult, not intractable: Secondary | ICD-10-CM

## 2020-05-06 NOTE — Telephone Encounter (Signed)
Requested medication (s) are due for refill today: yes  Requested medication (s) are on the active medication list:  yes  Last refill:  04/03/2020  Future visit scheduled: yes  Notes to clinic: this refill cannot be delegated    Requested Prescriptions  Pending Prescriptions Disp Refills   zonisamide (ZONEGRAN) 25 MG capsule 30 capsule 0    Sig: Take 1 capsule (25 mg total) by mouth daily.      Not Delegated - Neurology: Anticonvulsants - topiramate & zonisamide Failed - 05/06/2020  3:23 PM      Failed - This refill cannot be delegated      Passed - Cr in normal range and within 360 days    Creatinine, Ser  Date Value Ref Range Status  12/28/2019 0.94 0.44 - 1.00 mg/dL Final          Passed - CO2 in normal range and within 360 days    CO2  Date Value Ref Range Status  12/28/2019 22 22 - 32 mmol/L Final          Passed - Valid encounter within last 12 months    Recent Outpatient Visits           5 months ago Persistent cough for 3 weeks or longer   [facility] Fulp, [place], MD   11 months ago Mild intermittent asthma without complication   [facility] [place], Virginia J, NP   1 year ago Moderate asthma with exacerbation, unspecified whether persistent   [facility] Fulp, [place], MD   1 year ago Hypertension, unspecified type   [facility] [place], [place], NP   1 year ago History of abnormal mammogram   [facility] Fulp, [place], MD       Future Appointments             In 4 weeks [place], Marzella Schlein, PA-C [place] [facility]

## 2020-05-06 NOTE — Telephone Encounter (Signed)
Medication Refill - Medication:   zonisamide (ZONEGRAN) 25 MG capsule    Has the patient contacted their pharmacy? Yes.  need to contact the office.   Preferred Pharmacy (with phone number or street name):   [facility] - [place] ([place]), [place] - [address]  [address] [place] ([place]) [place] [address]  Phone: [phone] Fax: [phone]     Agent: Please be advised that RX refills may take up to 3 business days. We ask that you follow-up with your pharmacy.

## 2020-05-07 MED ORDER — ZONISAMIDE 25 MG PO CAPS: 25.0000 mg | ORAL_CAPSULE | Freq: Every day | ORAL | 0 refills | Status: DC

## 2020-05-08 ENCOUNTER — Other Ambulatory Visit: Payer: Self-pay | Admitting: Surgery

## 2020-05-08 DIAGNOSIS — N6489 Other specified disorders of breast: Secondary | ICD-10-CM

## 2020-05-08 NOTE — Telephone Encounter (Signed)
Patient is calling because her script still is not at the pharmacy.  She stated that she does have an appt. Scheduled for 03/31 and needs the medication to be filled.  Please advise and call patient to give her an update at [phone]

## 2020-05-11 MED ORDER — ZONISAMIDE 25 MG PO CAPS: 25.0000 mg | ORAL_CAPSULE | Freq: Every day | ORAL | 0 refills | Status: DC

## 2020-05-11 NOTE — Telephone Encounter (Signed)
Patient used to be Fulp's patient. Could she get courtesy refill until her appointment at end of month? Please advise.

## 2020-05-11 NOTE — Addendum Note (Signed)
Addended by: Lois Huxley, Jeannett Senior L on: 05/11/2020 02:08 PM   Modules accepted: Orders

## 2020-05-11 NOTE — Telephone Encounter (Signed)
Patient notified

## 2020-05-11 NOTE — Telephone Encounter (Signed)
Rx sent 

## 2020-05-12 ENCOUNTER — Other Ambulatory Visit: Payer: Self-pay | Admitting: Family Medicine

## 2020-05-12 DIAGNOSIS — G43C0 Periodic headache syndromes in child or adult, not intractable: Secondary | ICD-10-CM

## 2020-05-12 NOTE — Telephone Encounter (Signed)
Copied from CRM [id number]. Topic: Quick Communication - Rx Refill/Question >> May 12, 2020  9:50 AM Mcneil, Ja-Kwan wrote: Medication: zonisamide (ZONEGRAN) 25 MG capsule  Has the patient contacted their pharmacy? yes - Pharmacy informed pt that the Rx was not received. Rx shows as Librarian, academic (with phone number or street name): [facility] - [place] ([place]), [place] - [address] Phone: [phone]   Fax: [phone]  Agent: Please be advised that RX refills may take up to 3 business days. We ask that you follow-up with your pharmacy.

## 2020-05-12 NOTE — Telephone Encounter (Signed)
Requested medication (s) are due for refill today: no  Requested medication (s) are on the active medication list: yes  Last refill:  05/11/20  Future visit scheduled: yes  Notes to clinic:  This med not delegated to NT to RF or refuse   Requested Prescriptions  Pending Prescriptions Disp Refills   zonisamide (ZONEGRAN) 25 MG capsule [Pharmacy Med Name: Zonisamide 25 MG Oral Capsule] 30 capsule 0    Sig: TAKE 1 CAPSULE BY MOUTH ONCE DAILY. MUST HAVE OFFICE VISIT FOR REFILLS      Not Delegated - Neurology: Anticonvulsants - topiramate & zonisamide Failed - 05/12/2020  5:14 PM      Failed - This refill cannot be delegated      Passed - Cr in normal range and within 360 days    Creatinine, Ser  Date Value Ref Range Status  12/28/2019 0.94 0.44 - 1.00 mg/dL Final          Passed - CO2 in normal range and within 360 days    CO2  Date Value Ref Range Status  12/28/2019 22 22 - 32 mmol/L Final          Passed - Valid encounter within last 12 months    Recent Outpatient Visits           5 months ago Persistent cough for 3 weeks or longer   [facility] [place], [place], MD   11 months ago Mild intermittent asthma without complication   [facility] [place], [place] J, NP   1 year ago Moderate asthma with exacerbation, unspecified whether persistent   [facility] [place], [place], MD   1 year ago Hypertension, unspecified type   [facility] [place], [place], NP   1 year ago History of abnormal mammogram   [facility] [place], [place], MD       Future Appointments             In 3 weeks [place], Marzella Schlein, PA-C [facility]

## 2020-05-12 NOTE — Telephone Encounter (Signed)
Med not delegated to NT to RF or resend-  Med class: print and was not sent normal (electronically) Routing to [facility]

## 2020-05-13 ENCOUNTER — Other Ambulatory Visit: Payer: Self-pay | Admitting: *Deleted

## 2020-05-13 DIAGNOSIS — G43C0 Periodic headache syndromes in child or adult, not intractable: Secondary | ICD-10-CM

## 2020-05-13 MED ORDER — ZONISAMIDE 25 MG PO CAPS: 25.0000 mg | ORAL_CAPSULE | Freq: Every day | ORAL | 0 refills | Status: DC

## 2020-05-13 NOTE — Telephone Encounter (Signed)
   Notes to clinic:  Pharmacy didn't receive script Please resend   Requested Prescriptions  Pending Prescriptions Disp Refills   zonisamide (ZONEGRAN) 25 MG capsule [Pharmacy Med Name: Zonisamide 25 MG Oral Capsule] 30 capsule 0    Sig: TAKE 1 CAPSULE BY MOUTH ONCE DAILY. MUST HAVE OFFICE VISIT FOR REFILLS      Not Delegated - Neurology: Anticonvulsants - topiramate & zonisamide Failed - 05/13/2020  9:35 AM      Failed - This refill cannot be delegated      Passed - Cr in normal range and within 360 days    Creatinine, Ser  Date Value Ref Range Status  12/28/2019 0.94 0.44 - 1.00 mg/dL Final          Passed - CO2 in normal range and within 360 days    CO2  Date Value Ref Range Status  12/28/2019 22 22 - 32 mmol/L Final          Passed - Valid encounter within last 12 months    Recent Outpatient Visits           5 months ago Persistent cough for 3 weeks or longer   [facility] [place], [place], MD   11 months ago Mild intermittent asthma without complication   [facility] [place], [place] J, NP   1 year ago Moderate asthma with exacerbation, unspecified whether persistent   [facility] [place], [place], MD   1 year ago Hypertension, unspecified type   [facility] [place], [place], NP   1 year ago History of abnormal mammogram   [facility] [place], [place], MD       Future Appointments             In 3 weeks [place], Marzella Schlein, PA-C [facility]

## 2020-05-13 NOTE — Telephone Encounter (Signed)
Ordered as a print on 05/11/20. Please have provider send electronically to pharmacy on file. 3rd request from patient to be electronically sent.

## 2020-05-13 NOTE — Telephone Encounter (Signed)
Pharmacy never received Rx refill for zonisamide (ZONEGRAN) 25 MG capsule  / class on this RX says "PRINT" / please resend asap today

## 2020-05-13 NOTE — Telephone Encounter (Signed)
Patient medication was sent to pharmacy as requested. Validated with pharmacy that medication was sent before disconnecting call. Patient was reminded that appt was recommend for future refills.   She understands, verbalized understanding but was frustrated b/c her last appointments have been virtual.  Patient prefers to have in person appt when she comes for next OV.

## 2020-05-22 ENCOUNTER — Encounter (HOSPITAL_BASED_OUTPATIENT_CLINIC_OR_DEPARTMENT_OTHER): Payer: Self-pay | Admitting: Surgery

## 2020-05-22 ENCOUNTER — Other Ambulatory Visit: Payer: Self-pay

## 2020-05-23 ENCOUNTER — Inpatient Hospital Stay (HOSPITAL_COMMUNITY): Admission: RE | Admit: 2020-05-23 | Payer: 59 | Source: Ambulatory Visit

## 2020-05-25 ENCOUNTER — Other Ambulatory Visit (HOSPITAL_COMMUNITY)
Admission: RE | Admit: 2020-05-25 | Discharge: 2020-05-25 | Disposition: A | Discharge status: Home / Self Care | Payer: 59 | Source: Ambulatory Visit | Attending: Surgery | Admitting: Surgery

## 2020-05-25 DIAGNOSIS — Z01812 Encounter for preprocedural laboratory examination: Secondary | ICD-10-CM | POA: Insufficient documentation

## 2020-05-25 DIAGNOSIS — Z20822 Contact with and (suspected) exposure to covid-19: Secondary | ICD-10-CM | POA: Diagnosis not present

## 2020-05-26 ENCOUNTER — Other Ambulatory Visit: Payer: Self-pay

## 2020-05-26 ENCOUNTER — Ambulatory Visit
Admission: RE | Admit: 2020-05-26 | Discharge: 2020-05-26 | Disposition: A | Discharge status: Home / Self Care | Payer: 59 | Source: Ambulatory Visit | Attending: Surgery | Admitting: Surgery

## 2020-05-26 DIAGNOSIS — N6489 Other specified disorders of breast: Secondary | ICD-10-CM

## 2020-05-26 LAB — SARS CORONAVIRUS 2 (TAT 6-24 HRS): SARS Coronavirus 2: NEGATIVE

## 2020-05-26 NOTE — H&P (Signed)
   Tiffany Jones Appointment: 05/01/2020 9:40 AM Location: [facility] Patient #: [id number] DOB: 02/23/1966 Widowed / Language: [place] / Race: Black or African American Female   History of Present Illness (Dakarri Kessinger A. Magnus Ivan MD; 05/01/2020 9:57 AM) The patient is a 55 year old female who presents with a complaint of Breast problems.  Chief complaint: Complex sclerosing lesion breast  This is a 55 year old female who I saw last year for a complex lesion in the  right breast. Her surgery is scheduled but she needed  cardiac clearance which was then achieved in September. She has delayed her surgery multiple times for many different reasons. She does have a strong family history of breast cancer in her mother. She has had no problems since our last regarding her breasts of mild discomfort.   Allergies Doristine Devoid, CMA; 05/01/2020 9:41 AM) Sulfa Antibiotics   Medication History Doristine Devoid, CMA; 05/01/2020 9:41 AM) Haywood Pao HFA (110MCG/ACT Aerosol, Inhalation) Active. Albuterol Sulfate ((2.5 MG/3ML)0.083% Nebulized Soln, Inhalation) Active. Albuterol Sulfate HFA (108 (90 Base)MCG/ACT Aerosol Soln, Inhalation) Active. Gabapentin (100MG  Capsule, Oral) Active. Omeprazole (40MG  Capsule DR, Oral) Active. Zonisamide (25MG  Capsule, Oral) Active. Medications Reconciled    Physical Exam (Murice Barbar A. MD; 05/01/2020 9:58 AM) The physical exam findings are as follows: Note: She is well on exam  There is no palpable breast mass. She has large breasts bilaterally.    Assessment & Plan (Nazanin Kinner A. MD; 05/01/2020 9:58 AM)  SCLEROSING ADENOSIS OF BREAST, RIGHT (N60.21)  Impression: She now believes she is ready to proceed with a radioactive seed guided right breast lumpectomy. Again I explained this is to remove the area that is suspicious in the right breast for complete histologic evaluation to rule out malignancy especially when her family  history. I again discussed procedure in detail including the risks. She understands and wishes to proceed with surgery which will be scheduled. Current Plans

## 2020-05-26 NOTE — Progress Notes (Signed)
      Enhanced Recovery after Surgery for Orthopedics Enhanced Recovery after Surgery is a protocol used to improve the stress on your body and your recovery after surgery.  Patient Instructions  . The night before surgery:  o No food after midnight. ONLY clear liquids after midnight  . The day of surgery (if you do NOT have diabetes):  o Drink ONE (1) Pre-Surgery Clear Ensure as directed.   o This drink was given to you during your hospital  pre-op appointment visit. o The pre-op nurse will instruct you on the time to drink the  Pre-Surgery Ensure depending on your surgery time. o Finish the drink at the designated time by the pre-op nurse.  o Nothing else to drink after completing the  Pre-Surgery Clear Ensure.  . The day of surgery (if you have diabetes): o Drink ONE (1) Gatorade 2 (G2) as directed. o This drink was given to you during your hospital  pre-op appointment visit.  o The pre-op nurse will instruct you on the time to drink the   Gatorade 2 (G2) depending on your surgery time. o Color of the Gatorade may vary. Red is not allowed. o Nothing else to drink after completing the  Gatorade 2 (G2).         If you have questions, please contact your surgeon's office.  Surgical soap given with instructions. Pt verbalizes understanding. 

## 2020-05-27 ENCOUNTER — Ambulatory Visit
Admission: RE | Admit: 2020-05-27 | Discharge: 2020-05-27 | Disposition: A | Discharge status: Home / Self Care | Payer: 59 | Source: Ambulatory Visit | Attending: Surgery | Admitting: Surgery

## 2020-05-27 ENCOUNTER — Other Ambulatory Visit: Payer: Self-pay

## 2020-05-27 ENCOUNTER — Ambulatory Visit (HOSPITAL_BASED_OUTPATIENT_CLINIC_OR_DEPARTMENT_OTHER): Payer: 59 | Admitting: Certified Registered Nurse Anesthetist

## 2020-05-27 ENCOUNTER — Encounter (HOSPITAL_BASED_OUTPATIENT_CLINIC_OR_DEPARTMENT_OTHER): Payer: Self-pay | Admitting: Surgery

## 2020-05-27 ENCOUNTER — Ambulatory Visit (HOSPITAL_BASED_OUTPATIENT_CLINIC_OR_DEPARTMENT_OTHER)
Admission: RE | Admit: 2020-05-27 | Discharge: 2020-05-27 | Disposition: A | Discharge status: Home / Self Care | Payer: 59 | Attending: Surgery | Admitting: Surgery

## 2020-05-27 ENCOUNTER — Encounter (HOSPITAL_BASED_OUTPATIENT_CLINIC_OR_DEPARTMENT_OTHER)
Admission: RE | Disposition: A | Discharge status: Home / Self Care | Payer: Self-pay | Source: Home / Self Care | Attending: Surgery

## 2020-05-27 DIAGNOSIS — Z803 Family history of malignant neoplasm of breast: Secondary | ICD-10-CM | POA: Insufficient documentation

## 2020-05-27 DIAGNOSIS — Z7951 Long term (current) use of inhaled steroids: Secondary | ICD-10-CM | POA: Insufficient documentation

## 2020-05-27 DIAGNOSIS — Z79899 Other long term (current) drug therapy: Secondary | ICD-10-CM | POA: Diagnosis not present

## 2020-05-27 DIAGNOSIS — Z882 Allergy status to sulfonamides status: Secondary | ICD-10-CM | POA: Insufficient documentation

## 2020-05-27 DIAGNOSIS — N6021 Fibroadenosis of right breast: Secondary | ICD-10-CM | POA: Diagnosis not present

## 2020-05-27 DIAGNOSIS — N6489 Other specified disorders of breast: Secondary | ICD-10-CM

## 2020-05-27 HISTORY — PX: BREAST LUMPECTOMY WITH RADIOACTIVE SEED LOCALIZATION: SHX6424

## 2020-05-27 HISTORY — DX: Prediabetes: R73.03

## 2020-05-27 SURGERY — BREAST LUMPECTOMY WITH RADIOACTIVE SEED LOCALIZATION
Anesthesia: General | Site: Breast | Laterality: Right

## 2020-05-27 MED ORDER — KETOROLAC TROMETHAMINE 15 MG/ML IJ SOLN
15.0000 mg | INTRAMUSCULAR | Status: AC
  Administered 2020-05-27: 15 mg via INTRAVENOUS

## 2020-05-27 MED ORDER — PROPOFOL 10 MG/ML IV BOLUS
INTRAVENOUS | Status: DC | PRN
  Administered 2020-05-27: 200 mg via INTRAVENOUS

## 2020-05-27 MED ORDER — ACETAMINOPHEN 500 MG PO TABS
ORAL_TABLET | ORAL | Status: AC
  Filled 2020-05-27: qty 2

## 2020-05-27 MED ORDER — CHLORHEXIDINE GLUCONATE CLOTH 2 % EX PADS: 6.0000 | MEDICATED_PAD | Freq: Once | CUTANEOUS | Status: DC

## 2020-05-27 MED ORDER — DEXAMETHASONE SODIUM PHOSPHATE 10 MG/ML IJ SOLN
INTRAMUSCULAR | Status: AC
  Filled 2020-05-27: qty 1

## 2020-05-27 MED ORDER — KETOROLAC TROMETHAMINE 15 MG/ML IJ SOLN
INTRAMUSCULAR | Status: AC
  Filled 2020-05-27: qty 1

## 2020-05-27 MED ORDER — OXYCODONE HCL 5 MG PO TABS: 5.0000 mg | ORAL_TABLET | Freq: Once | ORAL | Status: DC | PRN

## 2020-05-27 MED ORDER — ONDANSETRON HCL 4 MG/2ML IJ SOLN: 4.0000 mg | Freq: Once | INTRAMUSCULAR | Status: DC | PRN

## 2020-05-27 MED ORDER — ONDANSETRON HCL 4 MG/2ML IJ SOLN
INTRAMUSCULAR | Status: AC
  Filled 2020-05-27: qty 2

## 2020-05-27 MED ORDER — MIDAZOLAM HCL 5 MG/5ML IJ SOLN
INTRAMUSCULAR | Status: DC | PRN
  Administered 2020-05-27: 2 mg via INTRAVENOUS

## 2020-05-27 MED ORDER — MIDAZOLAM HCL 2 MG/2ML IJ SOLN
INTRAMUSCULAR | Status: AC
  Filled 2020-05-27: qty 2

## 2020-05-27 MED ORDER — ENSURE PRE-SURGERY PO LIQD: 296.0000 mL | Freq: Once | ORAL | Status: DC

## 2020-05-27 MED ORDER — KETOROLAC TROMETHAMINE 30 MG/ML IJ SOLN
INTRAMUSCULAR | Status: DC | PRN
  Administered 2020-05-27: 30 mg via INTRAVENOUS

## 2020-05-27 MED ORDER — CEFAZOLIN SODIUM-DEXTROSE 2-4 GM/100ML-% IV SOLN
INTRAVENOUS | Status: AC
  Filled 2020-05-27: qty 100

## 2020-05-27 MED ORDER — ONDANSETRON HCL 4 MG/2ML IJ SOLN
INTRAMUSCULAR | Status: DC | PRN
  Administered 2020-05-27: 4 mg via INTRAVENOUS

## 2020-05-27 MED ORDER — LACTATED RINGERS IV SOLN: INTRAVENOUS | Status: DC

## 2020-05-27 MED ORDER — DEXAMETHASONE SODIUM PHOSPHATE 10 MG/ML IJ SOLN
INTRAMUSCULAR | Status: AC
  Filled 2020-05-27: qty 2

## 2020-05-27 MED ORDER — FENTANYL CITRATE (PF) 100 MCG/2ML IJ SOLN
INTRAMUSCULAR | Status: DC | PRN
  Administered 2020-05-27: 100 ug via INTRAVENOUS

## 2020-05-27 MED ORDER — LIDOCAINE 2% (20 MG/ML) 5 ML SYRINGE
INTRAMUSCULAR | Status: DC | PRN
  Administered 2020-05-27: 40 mg via INTRAVENOUS

## 2020-05-27 MED ORDER — ACETAMINOPHEN 500 MG PO TABS
1000.0000 mg | ORAL_TABLET | ORAL | Status: AC
  Administered 2020-05-27: 1000 mg via ORAL

## 2020-05-27 MED ORDER — BUPIVACAINE HCL (PF) 0.5 % IJ SOLN
INTRAMUSCULAR | Status: DC | PRN
  Administered 2020-05-27: 12 mL

## 2020-05-27 MED ORDER — GABAPENTIN 300 MG PO CAPS
ORAL_CAPSULE | ORAL | Status: AC
  Filled 2020-05-27: qty 1

## 2020-05-27 MED ORDER — CEFAZOLIN SODIUM-DEXTROSE 2-4 GM/100ML-% IV SOLN
2.0000 g | INTRAVENOUS | Status: AC
  Administered 2020-05-27: 2 g via INTRAVENOUS

## 2020-05-27 MED ORDER — DEXAMETHASONE SODIUM PHOSPHATE 10 MG/ML IJ SOLN
INTRAMUSCULAR | Status: DC | PRN
  Administered 2020-05-27: 10 mg via INTRAVENOUS

## 2020-05-27 MED ORDER — GABAPENTIN 300 MG PO CAPS: 300.0000 mg | ORAL_CAPSULE | ORAL | Status: DC

## 2020-05-27 MED ORDER — KETOROLAC TROMETHAMINE 30 MG/ML IJ SOLN
INTRAMUSCULAR | Status: AC
  Filled 2020-05-27: qty 1

## 2020-05-27 MED ORDER — FENTANYL CITRATE (PF) 100 MCG/2ML IJ SOLN: 25.0000 ug | INTRAMUSCULAR | Status: DC | PRN

## 2020-05-27 MED ORDER — OXYCODONE HCL 5 MG/5ML PO SOLN: 5.0000 mg | Freq: Once | ORAL | Status: DC | PRN

## 2020-05-27 MED ORDER — TRAMADOL HCL 50 MG PO TABS: 50.0000 mg | ORAL_TABLET | Freq: Four times a day (QID) | ORAL | 0 refills | Status: DC | PRN

## 2020-05-27 MED ORDER — FENTANYL CITRATE (PF) 100 MCG/2ML IJ SOLN
INTRAMUSCULAR | Status: AC
  Filled 2020-05-27: qty 2

## 2020-05-27 SURGICAL SUPPLY — 48 items
ADH SKN CLS APL DERMABOND .7 (GAUZE/BANDAGES/DRESSINGS) ×1
APL PRP STRL LF DISP 70% ISPRP (MISCELLANEOUS) ×1
APPLIER CLIP 9.375 MED OPEN (MISCELLANEOUS)
APR CLP MED 9.3 20 MLT OPN (MISCELLANEOUS)
BINDER BREAST 3XL (GAUZE/BANDAGES/DRESSINGS) IMPLANT
BINDER BREAST LRG (GAUZE/BANDAGES/DRESSINGS) IMPLANT
BINDER BREAST MEDIUM (GAUZE/BANDAGES/DRESSINGS) IMPLANT
BINDER BREAST XLRG (GAUZE/BANDAGES/DRESSINGS) IMPLANT
BINDER BREAST XXLRG (GAUZE/BANDAGES/DRESSINGS) IMPLANT
BLADE SURG 15 STRL LF DISP TIS (BLADE) ×1 IMPLANT
BLADE SURG 15 STRL SS (BLADE) ×2
CANISTER SUC SOCK COL 7IN (MISCELLANEOUS) IMPLANT
CANISTER SUCT 1200ML W/VALVE (MISCELLANEOUS) IMPLANT
CHLORAPREP W/TINT 26 (MISCELLANEOUS) ×2 IMPLANT
CLIP APPLIE 9.375 MED OPEN (MISCELLANEOUS) IMPLANT
COVER BACK TABLE 60X90IN (DRAPES) ×2 IMPLANT
COVER MAYO STAND STRL (DRAPES) ×2 IMPLANT
COVER PROBE W GEL 5X96 (DRAPES) ×2 IMPLANT
COVER WAND RF STERILE (DRAPES) IMPLANT
DECANTER SPIKE VIAL GLASS SM (MISCELLANEOUS) IMPLANT
DERMABOND ADVANCED (GAUZE/BANDAGES/DRESSINGS) ×1
DERMABOND ADVANCED .7 DNX12 (GAUZE/BANDAGES/DRESSINGS) ×1 IMPLANT
DRAPE LAPAROSCOPIC ABDOMINAL (DRAPES) ×2 IMPLANT
DRAPE UTILITY XL STRL (DRAPES) ×2 IMPLANT
ELECT REM PT RETURN 9FT ADLT (ELECTROSURGICAL) ×2
ELECTRODE REM PT RTRN 9FT ADLT (ELECTROSURGICAL) ×1 IMPLANT
GAUZE SPONGE 4X4 12PLY STRL LF (GAUZE/BANDAGES/DRESSINGS) IMPLANT
GLOVE SURG SIGNA 7.5 PF LTX (GLOVE) ×2 IMPLANT
GOWN STRL REUS W/ TWL LRG LVL3 (GOWN DISPOSABLE) ×1 IMPLANT
GOWN STRL REUS W/ TWL XL LVL3 (GOWN DISPOSABLE) ×1 IMPLANT
GOWN STRL REUS W/TWL LRG LVL3 (GOWN DISPOSABLE) ×2
GOWN STRL REUS W/TWL XL LVL3 (GOWN DISPOSABLE) ×2
KIT MARKER MARGIN INK (KITS) ×2 IMPLANT
NEEDLE HYPO 25X1 1.5 SAFETY (NEEDLE) ×2 IMPLANT
NS IRRIG 1000ML POUR BTL (IV SOLUTION) IMPLANT
PACK BASIN DAY SURGERY FS (CUSTOM PROCEDURE TRAY) ×2 IMPLANT
PENCIL SMOKE EVACUATOR (MISCELLANEOUS) ×2 IMPLANT
SLEEVE SCD COMPRESS KNEE MED (STOCKING) ×2 IMPLANT
SPONGE LAP 4X18 RFD (DISPOSABLE) ×2 IMPLANT
SUT MNCRL AB 4-0 PS2 18 (SUTURE) ×2 IMPLANT
SUT SILK 2 0 SH (SUTURE) IMPLANT
SUT VIC AB 3-0 SH 27 (SUTURE) ×2
SUT VIC AB 3-0 SH 27X BRD (SUTURE) ×1 IMPLANT
SYR CONTROL 10ML LL (SYRINGE) ×2 IMPLANT
TOWEL GREEN STERILE FF (TOWEL DISPOSABLE) ×2 IMPLANT
TRAY FAXITRON CT DISP (TRAY / TRAY PROCEDURE) ×2 IMPLANT
TUBE CONNECTING 20X1/4 (TUBING) IMPLANT
YANKAUER SUCT BULB TIP NO VENT (SUCTIONS) IMPLANT

## 2020-05-27 NOTE — Anesthesia Procedure Notes (Signed)
Procedure Name: LMA Insertion Date/Time: 05/27/2020 10:44 AM Performed by: [place], Shantal Roan C, CRNA Pre-anesthesia Checklist: Patient identified, Emergency Drugs available, Suction available and Patient being monitored Patient Re-evaluated:Patient Re-evaluated prior to induction Oxygen Delivery Method: Circle system utilized Preoxygenation: Pre-oxygenation with 100% oxygen Induction Type: IV induction Ventilation: Mask ventilation without difficulty LMA: LMA inserted LMA Size: 4.0 Number of attempts: 1 Airway Equipment and Method: Bite block Placement Confirmation: positive ETCO2 Tube secured with: Tape Dental Injury: Teeth and Oropharynx as per pre-operative assessment

## 2020-05-27 NOTE — Anesthesia Postprocedure Evaluation (Signed)
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  Procedure(s) Performed: RIGHT BREAST LUMPECTOMY WITH RADIOACTIVE SEED LOCALIZATION (Right Breast)     Patient location during evaluation: PACU Anesthesia Type: General Level of consciousness: awake and alert Pain management: pain level controlled Vital Signs Assessment: post-procedure vital signs reviewed and stable Respiratory status: spontaneous breathing, nonlabored ventilation, respiratory function stable and patient connected to nasal cannula oxygen Cardiovascular status: blood pressure returned to baseline and stable Postop Assessment: no apparent nausea or vomiting Anesthetic complications: no   No complications documented.  Last Vitals:  Vitals:   05/27/20 1130 05/27/20 1145  BP: 106/61 96/61  Pulse: 70 (!) 56  Resp: (!) 21 14  Temp:    SpO2: 100% 99%    Last Pain:  Vitals:   05/27/20 1125  TempSrc:   PainSc: 0-No pain                 Creta Dorame COKER

## 2020-05-27 NOTE — Interval H&P Note (Signed)
History and Physical Interval Note: no change in H and P  05/27/2020 10:13 AM  Tiffany Jones  has presented today for surgery, with the diagnosis of COMPLEX SCLEROSING LESION RIGHT BREAST.  The various methods of treatment have been discussed with the patient and family. After consideration of risks, benefits and other options for treatment, the patient has consented to  Procedure(s) with comments: RIGHT BREAST LUMPECTOMY WITH RADIOACTIVE SEED LOCALIZATION (Right) - LMA; START TIME OF 11:00 AM FOR 60 MINUTES ROOM 2 as a surgical intervention.  The patient's history has been reviewed, patient examined, no change in status, stable for surgery.  I have reviewed the patient's chart and labs.  Questions were answered to the patient's satisfaction.     Abigail Miyamoto

## 2020-05-27 NOTE — Op Note (Signed)
RIGHT BREAST LUMPECTOMY WITH RADIOACTIVE SEED LOCALIZATION  Procedure Note  Tiffany Jones 05/27/2020   Pre-op Diagnosis: COMPLEX SCLEROSING LESION RIGHT BREAST     Post-op Diagnosis: same  Procedure(s): RIGHT BREAST LUMPECTOMY WITH RADIOACTIVE SEED LOCALIZATION  Surgeon(s): Abigail Miyamoto, MD Hedda Slade, PA-C  Anesthesia: General  Staff:  Circulator: Patrice Paradise, RN Scrub Person: Valda Lamb, RN; Sandrea Hughs, CST  Estimated Blood Loss: Minimal               Specimens: sent to path  Indications: This is a 55 year old female was found to reserve to have a abnormal area in the right breast screen mammography.  She underwent a biopsy of this showing a complex grossing lesion.  She has deferred surgery until recently.  Is been recommended to remove this area for complete histologic evaluation to rule out malignancy especially given her family history of breast cancer  Procedure: The patient was brought to operating identifies correct patient.  She is placed upon the operating table general anesthesia was induced.  Her right breast was prepped and draped in usual sterile fashion.  Using the neoprobe the radioactive seed was located at almost 12 position 10 cm from the nipple.  I anesthetized skin over the top of the area with Marcaine and made a incision on the breast with a scalpel.  I then dissected down to the breast tissue with the cautery.  The area of the radioactive seed was easily located and grasped with Allis clamp.  I then performed a lumpectomy staying widely around the seed with the cautery.  Once the specimen was removed I marked all margins with pain.  An x-ray was performed confirming that the radioactive seed and previous biopsy clip were in the specimen.  This was then sent to pathology for evaluation.  I anesthetized incision further with Marcaine.  Hemostasis appeared to be achieved.  The subtenons tissue was then closed with 3-0 Vicryl sutures and  skin was closed with running 4-0 Monocryl.  Dermabond was then applied.  The patient tolerated the procedure well.  All the counts were correct at the end of the procedure.  The patient was then extubated in the operating room and taken in a stable condition to the recovery room.          Abigail Miyamoto   Date: 05/27/2020  Time: 11:10 AM

## 2020-05-27 NOTE — Anesthesia Preprocedure Evaluation (Signed)
Anesthesia Evaluation  Patient identified by MRN, date of birth, ID band Patient awake    Reviewed: Allergy & Precautions, NPO status , Patient's Chart, lab work & pertinent test results  Airway Mallampati: II  TM Distance: >3 FB Neck ROM: Full    Dental  (+) Teeth Intact, Dental Advisory Given   Pulmonary    breath sounds clear to auscultation       Cardiovascular  Rhythm:Regular Rate:Normal     Neuro/Psych    GI/Hepatic   Endo/Other    Renal/GU      Musculoskeletal   Abdominal   Peds  Hematology   Anesthesia Other Findings   Reproductive/Obstetrics                             Anesthesia Physical Anesthesia Plan  ASA: II  Anesthesia Plan:    Post-op Pain Management:    Induction: Intravenous  PONV Risk Score and Plan: Ondansetron and Dexamethasone  Airway Management Planned: LMA  Additional Equipment:   Intra-op Plan:   Post-operative Plan:   Informed Consent: I have reviewed the patients History and Physical, chart, labs and discussed the procedure including the risks, benefits and alternatives for the proposed anesthesia with the patient or authorized representative who has indicated his/her understanding and acceptance.     Dental advisory given  Plan Discussed with: CRNA and Anesthesiologist  Anesthesia Plan Comments:         Anesthesia Quick Evaluation

## 2020-05-27 NOTE — Discharge Instructions (Signed)
[facility] Office Phone Number [phone]  BREAST BIOPSY/ PARTIAL MASTECTOMY: POST OP INSTRUCTIONS  Always review your discharge instruction sheet given to you by the facility where your surgery was performed.  IF YOU HAVE DISABILITY OR FAMILY LEAVE FORMS, YOU MUST BRING THEM TO THE OFFICE FOR PROCESSING.  DO NOT GIVE THEM TO YOUR DOCTOR.  1. A prescription for pain medication may be given to you upon discharge.  Take your pain medication as prescribed, if needed.  If narcotic pain medicine is not needed, then you may take acetaminophen (Tylenol) or ibuprofen (Advil) as needed. *No tylenol or Ibuprofen until after 4:00 pm 2. Take your usually prescribed medications unless otherwise directed 3. If you need a refill on your pain medication, please contact your pharmacy.  They will contact our office to request authorization.  Prescriptions will not be filled after 5pm or on week-ends. 4. You should eat very light the first 24 hours after surgery, such as soup, crackers, pudding, etc.  Resume your normal diet the day after surgery. 5. Most patients will experience some swelling and bruising in the breast.  Ice packs and a good support bra will help.  Swelling and bruising can take several days to resolve.  6. It is common to experience some constipation if taking pain medication after surgery.  Increasing fluid intake and taking a stool softener will usually help or prevent this problem from occurring.  A mild laxative (Milk of Magnesia or Miralax) should be taken according to package directions if there are no bowel movements after 48 hours. 7. Unless discharge instructions indicate otherwise, you may remove your bandages 24-48 hours after surgery, and you may shower at that time.  You may have steri-strips (small skin tapes) in place directly over the incision.  These strips should be left on the skin for 7-10 days.  If your surgeon used skin glue on the incision, you may shower in  24 hours.  The glue will flake off over the next 2-3 weeks.  Any sutures or staples will be removed at the office during your follow-up visit. 8. ACTIVITIES:  You may resume regular daily activities (gradually increasing) beginning the next day.  Wearing a good support bra or sports bra minimizes pain and swelling.  You may have sexual intercourse when it is comfortable. a. You may drive when you no longer are taking prescription pain medication, you can comfortably wear a seatbelt, and you can safely maneuver your car and apply brakes. b. RETURN TO WORK:  __MARCH 31, 2022 c. ___________________________________________________________________________________ 9. You should see your doctor in the office for a follow-up appointment approximately two weeks after your surgery.  Your doctor's nurse will typically make your follow-up appointment when she calls you with your pathology report.  Expect your pathology report 2-3 business days after your surgery.  You may call to check if you do not hear from [place] after three days. 10. OTHER INSTRUCTIONS:OK TO SHOWER STARTING TOMORROW 11. ICE PACK, TYLENOL, AND IBUPROFEN ALSO FOR PAIN 12. NO VIGOROUS ACTIVITY FOR ONE WEEK _______________________________________________________________________________________________ _____________________________________________________________________________________________________________________________________ _____________________________________________________________________________________________________________________________________ _____________________________________________________________________________________________________________________________________  WHEN TO CALL YOUR DOCTOR: 1. Fever over 101.0 2. Nausea and/or vomiting. 3. Extreme swelling or bruising. 4. Continued bleeding from incision. 5. Increased pain, redness, or drainage from the incision.  The clinic staff is available to answer your  questions during regular business hours.  Please don't hesitate to call and ask to speak to one of the nurses for clinical concerns.  If you have a medical  emergency, go to the nearest emergency room or call 911.  A surgeon from [facility] is always on call at the hospital.   Post Anesthesia Home Care Instructions  Activity: Get plenty of rest for the remainder of the day. A responsible individual must stay with you for 24 hours following the procedure.  For the next 24 hours, DO NOT: -Drive a car -Advertising copywriter -Drink alcoholic beverages -Take any medication unless instructed by your physician -Make any legal decisions or sign important papers.  Meals: Start with liquid foods such as gelatin or soup. Progress to regular foods as tolerated. Avoid greasy, spicy, heavy foods. If nausea and/or vomiting occur, drink only clear liquids until the nausea and/or vomiting subsides. Call your physician if vomiting continues.  Special Instructions/Symptoms: Your throat may feel dry or sore from the anesthesia or the breathing tube placed in your throat during surgery. If this causes discomfort, gargle with warm salt water. The discomfort should disappear within 24 hours.  If you had a scopolamine patch placed behind your ear for the management of post- operative nausea and/or vomiting:  1. The medication in the patch is effective for 72 hours, after which it should be removed.  Wrap patch in a tissue and discard in the trash. Wash hands thoroughly with soap and water. 2. You may remove the patch earlier than 72 hours if you experience unpleasant side effects which may include dry mouth, dizziness or visual disturbances. 3. Avoid touching the patch. Wash your hands with soap and water after contact with the patch.       To Whom it May Concern,  Tiffany Jones is recovering from surgery  She may return to work on Thursday, March 31st to full activity without limits  Thank  You,  Dr. [facility]  For further questions, please visit [email or web address]

## 2020-05-27 NOTE — Transfer of Care (Signed)
Immediate Anesthesia Transfer of Care Note  Patient: Tiffany Jones  Procedure(s) Performed: RIGHT BREAST LUMPECTOMY WITH RADIOACTIVE SEED LOCALIZATION (Right Breast)  Patient Location: PACU  Anesthesia Type:General  Level of Consciousness: awake, alert  and oriented  Airway & Oxygen Therapy: Patient Spontanous Breathing and Patient connected to face mask oxygen  Post-op Assessment: Report given to RN and Post -op Vital signs reviewed and stable  Post vital signs: Reviewed and stable  Last Vitals:  Vitals Value Taken Time  BP 110/67 05/27/20 1124  Temp    Pulse 64 05/27/20 1126  Resp 11 05/27/20 1126  SpO2 100 % 05/27/20 1126  Vitals shown include unvalidated device data.  Last Pain:  Vitals:   05/27/20 0951  TempSrc: Oral  PainSc: 0-No pain      Patients Stated Pain Goal: 5 (05/27/20 0951)  Complications: No complications documented.

## 2020-05-28 ENCOUNTER — Encounter (HOSPITAL_BASED_OUTPATIENT_CLINIC_OR_DEPARTMENT_OTHER): Payer: Self-pay | Admitting: Surgery

## 2020-06-01 LAB — SURGICAL PATHOLOGY

## 2020-06-04 ENCOUNTER — Other Ambulatory Visit: Payer: Self-pay

## 2020-06-04 ENCOUNTER — Ambulatory Visit: Payer: 59 | Attending: Physician Assistant | Admitting: Physician Assistant

## 2020-06-04 ENCOUNTER — Encounter: Payer: Self-pay | Admitting: Physician Assistant

## 2020-06-04 VITALS — BP 128/81 | HR 70 | Resp 18 | Ht 64.0 in | Wt 263.8 lb

## 2020-06-04 DIAGNOSIS — G43909 Migraine, unspecified, not intractable, without status migrainosus: Secondary | ICD-10-CM | POA: Diagnosis not present

## 2020-06-04 DIAGNOSIS — G43C0 Periodic headache syndromes in child or adult, not intractable: Secondary | ICD-10-CM | POA: Diagnosis not present

## 2020-06-04 DIAGNOSIS — I10 Essential (primary) hypertension: Secondary | ICD-10-CM

## 2020-06-04 DIAGNOSIS — K219 Gastro-esophageal reflux disease without esophagitis: Secondary | ICD-10-CM

## 2020-06-04 DIAGNOSIS — Z1211 Encounter for screening for malignant neoplasm of colon: Secondary | ICD-10-CM

## 2020-06-04 DIAGNOSIS — J4520 Mild intermittent asthma, uncomplicated: Secondary | ICD-10-CM

## 2020-06-04 MED ORDER — FLOVENT HFA 110 MCG/ACT IN AERO: 2.0000 | INHALATION_SPRAY | Freq: Two times a day (BID) | RESPIRATORY_TRACT | 11 refills | Status: AC

## 2020-06-04 MED ORDER — GABAPENTIN 100 MG PO CAPS: 100.0000 mg | ORAL_CAPSULE | Freq: Two times a day (BID) | ORAL | 5 refills | Status: DC

## 2020-06-04 MED ORDER — ZONISAMIDE 25 MG PO CAPS
25.0000 mg | ORAL_CAPSULE | Freq: Every day | ORAL | 4 refills | Status: DC
Start: 2020-06-04 — End: 2020-08-24

## 2020-06-04 MED ORDER — ALBUTEROL SULFATE HFA 108 (90 BASE) MCG/ACT IN AERS: 2.0000 | INHALATION_SPRAY | Freq: Four times a day (QID) | RESPIRATORY_TRACT | 99 refills | Status: AC | PRN

## 2020-06-04 MED ORDER — OMEPRAZOLE 40 MG PO CPDR: 40.0000 mg | DELAYED_RELEASE_CAPSULE | Freq: Every day | ORAL | 5 refills | Status: DC

## 2020-06-04 NOTE — Progress Notes (Signed)
Patient ID: Tiffany Jones, female   DOB: 10-20-1965, 55 y.o.   MRN: [id number]   Tiffany Jones, is a 55 y.o. female  [id number]  [id number]  DOB - 01-Nov-1965  Subjective:  Chief Complaint and HPI: Tiffany Jones is a 55 y.o. female here today for med RF.  Previous patient of Dr Debroah Baller.  Recent lumpectomy followed by Dr Magnus Ivan.  She would like a neurology referral bc has not seen a neurologist since she moved here and used to be followed by one in [place].  Her HA are stable.    Also needs colonoscopy and have reflux rechecked.  Asthma is stable.     ROS:   Constitutional:  No f/c, No night sweats, No unexplained weight loss. EENT:  No vision changes, No blurry vision, No hearing changes. No mouth, throat, or ear problems.  Respiratory: No cough, No SOB Cardiac: No CP, no palpitations GI:  No abd pain, No N/V/D. GU: No Urinary s/sx Musculoskeletal: No joint pain Neuro: No new headache, no dizziness, no motor weakness.  Skin: No rash Endocrine:  No polydipsia. No polyuria.  Psych: Denies SI/HI  No problems updated.  ALLERGIES: Allergies  Allergen Reactions  . Sulfa Antibiotics Hives    PAST MEDICAL HISTORY: Past Medical History:  Diagnosis Date  . Asthma   . Enlarged heart   . Gastroesophageal reflux disease without esophagitis 05/17/2018  . GERD (gastroesophageal reflux disease)   . History of abnormal mammogram   . Migraine without status migrainosus, not intractable 05/17/2018  . Migraines   . Pre-diabetes   . Sleep disturbance 05/17/2018    MEDICATIONS AT HOME: Prior to Admission medications   Medication Sig Start Date End Date Taking? Authorizing Provider  albuterol (PROVENTIL) (2.5 MG/3ML) 0.083% nebulizer solution Take 3 mLs (2.5 mg total) by nebulization every 6 (six) hours as needed for wheezing or shortness of breath. 06/10/19  Yes Zonia Kief, Amy J, NP  loratadine (CLARITIN) 10 MG tablet Take 10 mg by mouth daily.   Yes [provider]  traMADol (ULTRAM) 50 MG tablet Take 1-2 tablets (50-100 mg total) by mouth every 6 (six) hours as needed for moderate pain or severe pain. 05/27/20  Yes Abigail Miyamoto, MD  albuterol (VENTOLIN HFA) 108 (90 Base) MCG/ACT inhaler Inhale 2 puffs into the lungs every 6 (six) hours as needed for wheezing or shortness of breath. 06/04/20   Anders Simmonds, PA-C  fluticasone (FLONASE) 50 MCG/ACT nasal spray Place 1-2 sprays into both nostrils daily for 7 days. 11/23/19 11/30/19  Wieters, Hallie C, PA-C  fluticasone (FLOVENT HFA) 110 MCG/ACT inhaler Inhale 2 puffs into the lungs 2 (two) times daily. 06/04/20   Anders Simmonds, PA-C  gabapentin (NEURONTIN) 100 MG capsule Take 1 capsule (100 mg total) by mouth 2 (two) times daily. 06/04/20   Anders Simmonds, PA-C  omeprazole (PRILOSEC) 40 MG capsule Take 1 capsule (40 mg total) by mouth daily. To decrease stomach acid 06/04/20   Georgian Co M, PA-C  zonisamide (ZONEGRAN) 25 MG capsule Take 1 capsule (25 mg total) by mouth daily. 06/04/20   Anders Simmonds, PA-C     Objective:  EXAM:   Vitals:   06/04/20 1615  BP: 128/81  Pulse: 70  Resp: 18  SpO2: 99%  Weight: 263 lb 12.8 oz (119.7 kg)  Height: 5\' 4"  (1.626 m)    General appearance : A&OX3. NAD. Non-toxic-appearing HEENT: Atraumatic and Normocephalic.  PERRLA. EOM intact.  Chest/Lungs:  Breathing-non-labored, Good air entry bilaterally,  breath sounds normal without rales, rhonchi, or wheezing  CVS: S1 S2 regular, no murmurs, gallops, rubs  Extremities: Bilateral Lower Ext shows no edema, both legs are warm to touch with = pulse throughout Neurology:  CN II-XII grossly intact, Non focal.   Psych:  TP linear. J/I WNL. Normal speech. Appropriate eye contact and affect.  Skin:  No Rash  Data Review Lab Results  Component Value Date   HGBA1C 5.7 11/21/2018   HGBA1C 5.8 (H) 07/18/2018   HGBA1C 5.3 09/19/2017     Assessment & Plan   1. Hypertension, unspecified type Stable not  on meds - Comprehensive metabolic panel - CBC with Differential/Platelet  2. Gastroesophageal reflux disease without esophagitis - omeprazole (PRILOSEC) 40 MG capsule; Take 1 capsule (40 mg total) by mouth daily. To decrease stomach acid  Dispense: 30 capsule; Refill: 5  3. Migraine without status migrainosus, not intractable, unspecified migraine type See #4  4. Periodic headache syndrome, not intractable - zonisamide (ZONEGRAN) 25 MG capsule; Take 1 capsule (25 mg total) by mouth daily.  Dispense: 30 capsule; Refill: 4 - gabapentin (NEURONTIN) 100 MG capsule; Take 1 capsule (100 mg total) by mouth 2 (two) times daily.  Dispense: 60 capsule; Refill: 5 - Ambulatory referral to Neurology  5. Mild intermittent asthma without complication - fluticasone (FLOVENT HFA) 110 MCG/ACT inhaler; Inhale 2 puffs into the lungs 2 (two) times daily.  Dispense: 1 each; Refill: 11 - albuterol (VENTOLIN HFA) 108 (90 Base) MCG/ACT inhaler; Inhale 2 puffs into the lungs every 6 (six) hours as needed for wheezing or shortness of breath.  Dispense: 18 g; Refill: prn  6. Screening for colon cancer - Ambulatory referral to Gastroenterology   Patient have been counseled extensively about nutrition and exercise  Return in about 6 months (around 12/04/2020) for assign PCP.  The patient was given clear instructions to go to ER or return to medical center if symptoms don't improve, worsen or new problems develop. The patient verbalized understanding. The patient was told to call to get lab results if they haven't heard anything in the next week.     Georgian Co, PA-C [facility] [place], [place] [phone]   06/04/2020, 4:28 PM

## 2020-06-04 NOTE — H&P (Signed)
  Tiffany Jones Appointment: 05/01/2020 9:40 AM Location: [facility] Patient #: [id number] DOB: [birth year] Widowed / Language: [place] / Race: Black or African American Female   History of Present Illness (Tiffany Letts A. Magnus Ivan MD; 05/01/2020 9:57 AM) The patient is a 55 year old female who presents with a complaint of Breast problems.  Chief complaint: Complex sclerosing lesion breast  This is a 55 year old female who I saw last year for a complex lesion in the  right breast. Her surgery is scheduled but she needed  cardiac clearance which was then achieved in September. She has delayed her surgery multiple times for many different reasons. She does have a strong family history of breast cancer in her mother. She has had no problems since our last regarding her breasts of mild discomfort.  Past Medical History:  Diagnosis Date  . Asthma   . Enlarged heart   . Gastroesophageal reflux disease without esophagitis 05/17/2018  . GERD (gastroesophageal reflux disease)   . History of abnormal mammogram   . Migraine without status migrainosus, not intractable 05/17/2018  . Migraines   . Sleep disturbance 05/17/2018        Patient Active Problem List   Diagnosis Date Noted  . Migraine without status migrainosus, not intractable 05/17/2018  . Gastroesophageal reflux disease without esophagitis 05/17/2018  . Sleep disturbance 05/17/2018         Past Surgical History:  Procedure Laterality Date  . ECTOPIC PREGNANCY SURGERY    . Right breast biopsy                  OB History   No obstetric history on file.          Family History  Problem Relation Age of Onset  . Breast cancer Mother 38  . Hypertension Father   . Alcohol abuse Father   . Lymphoma Maternal Grandfather   . Asthma Son     Social History        Tobacco Use  . Smoking status: Never Smoker  . Smokeless tobacco: Never Used  Vaping Use  . Vaping Use: Never used   Substance Use Topics  . Alcohol use: Yes    Comment: socially  . Drug use: Never     Allergies Doristine Devoid, CMA; 05/01/2020 9:41 AM) Sulfa Antibiotics   Medication History Doristine Devoid, CMA; 05/01/2020 9:41 AM) Haywood Pao HFA (110MCG/ACT Aerosol, Inhalation) Active. Albuterol Sulfate ((2.5 MG/3ML)0.083% Nebulized Soln, Inhalation) Active. Albuterol Sulfate HFA (108 (90 Base)MCG/ACT Aerosol Soln, Inhalation) Active. Gabapentin (100MG  Capsule, Oral) Active. Omeprazole (40MG  Capsule DR, Oral) Active. Zonisamide (25MG  Capsule, Oral) Active. Medications Reconciled    Physical Exam (Jackob Crookston A. MD; 05/01/2020 9:58 AM) The physical exam findings are as follows: Note: She is well on exam  There is no palpable breast mass. She has large breasts bilaterally.    Assessment & Plan (Karington Zarazua A. MD; 05/01/2020 9:58 AM)  SCLEROSING ADENOSIS OF BREAST, RIGHT (N60.21)  Impression: She now believes she is ready to proceed with a radioactive seed guided right breast lumpectomy. Again I explained this is to remove the area that is suspicious in the right breast for complete histologic evaluation to rule out malignancy especially when her family history. I again discussed procedure in detail including the risks. She understands and wishes to proceed with surgery which will be scheduled. Current Plans

## 2020-06-05 LAB — COMPREHENSIVE METABOLIC PANEL
ALT: 12 IU/L (ref 0–32)
AST: 12 IU/L (ref 0–40)
Albumin/Globulin Ratio: 1.4 (ref 1.2–2.2)
Albumin: 4.3 g/dL (ref 3.8–4.9)
Alkaline Phosphatase: 79 IU/L (ref 44–121)
BUN/Creatinine Ratio: 17 (ref 9–23)
BUN: 13 mg/dL (ref 6–24)
Bilirubin Total: 0.3 mg/dL (ref 0.0–1.2)
CO2: 22 mmol/L (ref 20–29)
Calcium: 9.8 mg/dL (ref 8.7–10.2)
Chloride: 105 mmol/L (ref 96–106)
Creatinine, Ser: 0.78 mg/dL (ref 0.57–1.00)
Globulin, Total: 3.1 g/dL (ref 1.5–4.5)
Glucose: 100 mg/dL — ABNORMAL HIGH (ref 65–99)
Potassium: 4.5 mmol/L (ref 3.5–5.2)
Sodium: 140 mmol/L (ref 134–144)
Total Protein: 7.4 g/dL (ref 6.0–8.5)
eGFR: 91 mL/min/{1.73_m2} (ref >59)

## 2020-06-05 LAB — CBC WITH DIFFERENTIAL/PLATELET
Basophils Absolute: 0.1 10*3/uL (ref 0.0–0.2)
Basos: 1 %
EOS (ABSOLUTE): 0.1 10*3/uL (ref 0.0–0.4)
Eos: 2 %
Hematocrit: 35.6 % (ref 34.0–46.6)
Hemoglobin: 11.5 g/dL (ref 11.1–15.9)
Immature Grans (Abs): 0 10*3/uL (ref 0.0–0.1)
Immature Granulocytes: 0 %
Lymphocytes Absolute: 2.5 10*3/uL (ref 0.7–3.1)
Lymphs: 26 %
MCH: 28.6 pg (ref 26.6–33.0)
MCHC: 32.3 g/dL (ref 31.5–35.7)
MCV: 89 fL (ref 79–97)
Monocytes Absolute: 0.4 10*3/uL (ref 0.1–0.9)
Monocytes: 5 %
Neutrophils Absolute: 6.2 10*3/uL (ref 1.4–7.0)
Neutrophils: 66 %
Platelets: 314 10*3/uL (ref 150–450)
RBC: 4.02 x10E6/uL (ref 3.77–5.28)
RDW: 12.3 % (ref 11.7–15.4)
WBC: 9.3 10*3/uL (ref 3.4–10.8)

## 2020-07-09 ENCOUNTER — Encounter: Payer: Self-pay | Admitting: Internal Medicine

## 2020-08-19 ENCOUNTER — Ambulatory Visit: Payer: 59 | Admitting: Neurology

## 2020-08-19 ENCOUNTER — Encounter: Payer: Self-pay | Admitting: Neurology

## 2020-08-19 VITALS — BP 109/72 | HR 65 | Ht 64.0 in | Wt 267.0 lb

## 2020-08-19 DIAGNOSIS — G43009 Migraine without aura, not intractable, without status migrainosus: Secondary | ICD-10-CM

## 2020-08-19 DIAGNOSIS — R0683 Snoring: Secondary | ICD-10-CM

## 2020-08-19 DIAGNOSIS — R519 Headache, unspecified: Secondary | ICD-10-CM

## 2020-08-19 MED ORDER — ELETRIPTAN HYDROBROMIDE 40 MG PO TABS: 40.0000 mg | ORAL_TABLET | ORAL | 11 refills | Status: DC | PRN

## 2020-08-19 MED ORDER — ONDANSETRON 4 MG PO TBDP: 4.0000 mg | ORAL_TABLET | Freq: Three times a day (TID) | ORAL | 3 refills | Status: DC | PRN

## 2020-08-19 NOTE — Progress Notes (Signed)
[facility]    Provider:  Dr Lucia Gaskins Requesting Provider: Anders Simmonds, PA-C Primary Care Provider:  Patient, No Pcp Per (Inactive)  CC:  migraines  HPI:  Tiffany Jones is a 55 y.o. female here as requested by Anders Simmonds, PA-C for periodic headaches. She is establishing care for her migraines, she saw a neurologist in [place], headaches are stable on zonisamide and gabapentin.She has been using these as a preventative for her migraines she has been on them since 2009. She cannot tolerate a higher dose of Gabapentin because of her weight and felt it was affecting her low blood pressure. When she decreased the gabapentin she felt better. She has low blood pressure and has orthostatic hypotension and syncope, she had extensive testing. Migraines started at the age of 52. Pulsating/pounding, sound sensitivity, sound sensitivity, nausea. Start on the left around the eye, they can radiate to the right side and back but mainly left side of the head, movement makes it worse, her neurologist in [place] had her on Relpax, She has on average she has 2 migraines. Would be worse with period but she is menopausal.  Reviewed notes, labs and imaging from outside physicians, which showed:  Cbc,cmp normal 06/04/2020  From a thorough review of records, medications tried that can be used for migraine management: Propranolol contraindicated due to asthma, zonisamide, topiramate, relpax, sumatriptan, rizatriptan, zolmitriptan  Review of Systems: Patient complains of symptoms per HPI as well as the following symptoms headaches.  Pertinent negatives and positives per HPI. All others negative.   Social History   Socioeconomic History   Marital status: Widowed    Spouse name: Not on file   Number of children: 4   Years of education: 12   Highest education level: High school graduate  Occupational History   Not on file  Tobacco Use   Smoking status: Never   Smokeless tobacco: Never   Vaping Use   Vaping Use: Never used  Substance and Sexual Activity   Alcohol use: Yes    Comment: socially   Drug use: Never   Sexual activity: Not Currently    Birth control/protection: Post-menopausal  Other Topics Concern   Not on file  Social History Narrative   Lives with 2 son   Left handed   Caffeine: coffee 2 cups a day and about 2 other caffeine products a week.    Social Determinants of Health   Financial Resource Strain: Not on file  Food Insecurity: Not on file  Transportation Needs: Not on file  Physical Activity: Not on file  Stress: Not on file  Social Connections: Not on file  Intimate Partner Violence: Not on file    Family History  Problem Relation Age of Onset   Breast cancer Mother 63   Hypertension Father    Alcohol abuse Father    Lymphoma Maternal Grandfather    Asthma Son     Past Medical History:  Diagnosis Date   Asthma    Enlarged heart    Gastroesophageal reflux disease without esophagitis 05/17/2018   GERD (gastroesophageal reflux disease)    History of abnormal mammogram    Migraine without status migrainosus, not intractable 05/17/2018   Migraines    Pre-diabetes    Sleep disturbance 05/17/2018    Patient Active Problem List   Diagnosis Date Noted   Migraine without status migrainosus, not intractable 05/17/2018   Gastroesophageal reflux disease without esophagitis 05/17/2018   Sleep disturbance 05/17/2018    Past Surgical History:  Procedure Laterality Date   BREAST LUMPECTOMY WITH RADIOACTIVE SEED LOCALIZATION Right 05/27/2020   Procedure: RIGHT BREAST LUMPECTOMY WITH RADIOACTIVE SEED LOCALIZATION;  Surgeon: Abigail Miyamoto, MD;  Location: [facility];  Service: General;  Laterality: Right;  LMA; START TIME OF 11:00 AM FOR 60 MINUTES ROOM 2   DILATION AND CURETTAGE OF UTERUS     ECTOPIC PREGNANCY SURGERY     Right breast biopsy     WISDOM TOOTH EXTRACTION      Current Outpatient Medications  Medication  Sig Dispense Refill   albuterol (PROVENTIL) (2.5 MG/3ML) 0.083% nebulizer solution Take 3 mLs (2.5 mg total) by nebulization every 6 (six) hours as needed for wheezing or shortness of breath. 150 mL 0   albuterol (VENTOLIN HFA) 108 (90 Base) MCG/ACT inhaler Inhale 2 puffs into the lungs every 6 (six) hours as needed for wheezing or shortness of breath. 18 g prn   eletriptan (RELPAX) 40 MG tablet Take 1 tablet (40 mg total) by mouth as needed for migraine or headache. May repeat in 2 hours if headache persists or recurs. 9 tablet 11   fluticasone (FLOVENT HFA) 110 MCG/ACT inhaler Inhale 2 puffs into the lungs 2 (two) times daily. 1 each 11   gabapentin (NEURONTIN) 100 MG capsule Take 1 capsule (100 mg total) by mouth 2 (two) times daily. 60 capsule 5   loratadine (CLARITIN) 10 MG tablet Take 10 mg by mouth daily.     omeprazole (PRILOSEC) 40 MG capsule Take 1 capsule (40 mg total) by mouth daily. To decrease stomach acid 30 capsule 5   ondansetron (ZOFRAN-ODT) 4 MG disintegrating tablet Take 1-2 tablets (4-8 mg total) by mouth every 8 (eight) hours as needed. For migraine and/or nausea. 30 tablet 3   zonisamide (ZONEGRAN) 25 MG capsule Take 1 capsule (25 mg total) by mouth daily. 30 capsule 4   fluticasone (FLONASE) 50 MCG/ACT nasal spray Place 1-2 sprays into both nostrils daily for 7 days. 1 g 0   No current facility-administered medications for this visit.    Allergies as of 08/19/2020 - Review Complete 08/19/2020  Allergen Reaction Noted   Sulfa antibiotics Hives 03/22/2017    Vitals: BP 109/72   Pulse 65   Ht 5\' 4"  (1.626 m)   Wt 267 lb (121.1 kg)   LMP 07/18/2017   BMI 45.83 kg/m  Last Weight:  Wt Readings from Last 1 Encounters:  08/19/20 267 lb (121.1 kg)   Last Height:   Ht Readings from Last 1 Encounters:  08/19/20 5\' 4"  (1.626 m)     Physical exam: Exam: Gen: NAD, conversant, well nourised, obese, well groomed                     CV: RRR, no MRG. No Carotid Bruits.  No peripheral edema, warm, nontender Eyes: Conjunctivae clear without exudates or hemorrhage  Neuro: Detailed Neurologic Exam  Speech:    Speech is normal; fluent and spontaneous with normal comprehension.  Cognition:    The patient is oriented to person, place, and time;     recent and remote memory intact;     language fluent;     normal attention, concentration,     fund of knowledge Cranial Nerves:    The pupils are equal, round, and reactive to light. The fundi are normal and spontaneous venous pulsations are present. Visual fields are full to finger confrontation. Extraocular movements are intact. Trigeminal sensation is intact and the muscles of mastication are  normal. The face is symmetric. The palate elevates in the midline. Hearing intact. Voice is normal. Shoulder shrug is normal. The tongue has normal motion without fasciculations.   Coordination:    Normal   Gait:    normal.   Motor Observation:    No asymmetry, no atrophy, and no involuntary movements noted. Tone:    Normal muscle tone.    Posture:    Posture is normal. normal erect    Strength:    Strength is V/V in the upper and lower limbs.      Sensation: intact to LT     Reflex Exam:  DTR's:    Deep tendon reflexes in the upper and lower extremities are normal bilaterally.   Toes:    The toes are downgoing bilaterally.   Clonus:    Clonus is absent.    Assessment/Plan:  Patient with chronic migraines doing well on current preventative meds (gabapentin/zonisamide). We discussed some of the new medications and she prefers to stay on her regimen. Discussed acute management and we decided on Relpax (worked great in the past for her) and zofran at onset   Snoring, wakes herself up, morning headaches: sleep study  Relpax and Ondansetron acutely  Continue Zonegran and Neurontin preventatively Consider MRI in the future if needed. She declines at this time and there is no indication for  imaging.  Orders Placed This Encounter  Procedures   TSH   Ambulatory referral to Sleep Studies    Meds ordered this encounter  Medications   eletriptan (RELPAX) 40 MG tablet    Sig: Take 1 tablet (40 mg total) by mouth as needed for migraine or headache. May repeat in 2 hours if headache persists or recurs.    Dispense:  9 tablet    Refill:  11   ondansetron (ZOFRAN-ODT) 4 MG disintegrating tablet    Sig: Take 1-2 tablets (4-8 mg total) by mouth every 8 (eight) hours as needed. For migraine and/or nausea.    Dispense:  30 tablet    Refill:  3   Discussed: To prevent or relieve headaches, try the following: Cool Compress. Lie down and place a cool compress on your head.  Avoid headache triggers. If certain foods or odors seem to have triggered your migraines in the past, avoid them. A headache diary might help you identify triggers.  Include physical activity in your daily routine. Try a daily walk or other moderate aerobic exercise.  Manage stress. Find healthy ways to cope with the stressors, such as delegating tasks on your to-do list.  Practice relaxation techniques. Try deep breathing, yoga, massage and visualization.  Eat regularly. Eating regularly scheduled meals and maintaining a healthy diet might help prevent headaches. Also, drink plenty of fluids.  Follow a regular sleep schedule. Sleep deprivation might contribute to headaches Consider biofeedback. With this mind-body technique, you learn to control certain bodily functions -- such as muscle tension, heart rate and blood pressure -- to prevent headaches or reduce headache pain.    Proceed to emergency room if you experience new or worsening symptoms or symptoms do not resolve, if you have new neurologic symptoms or if headache is severe, or for any concerning symptom.   Provided education and documentation from American headache Society toolbox including articles on: chronic migraine medication overuse headache, chronic  migraines, prevention of migraines, behavioral and other nonpharmacologic treatments for headache.   Cc: Anders Simmonds, PA-C,  Patient, No Pcp Per (Inactive)  Naomie Dean, MD  [facility] [address] [place], [place] [address]  Phone [phone] Fax [phone]

## 2020-08-19 NOTE — Patient Instructions (Signed)
Sleep appointment  Relpax and Ondansetron: Please take one tablet at the onset of your headache. If it does not improve the symptoms please take one additional tablet. Do not take more then 2 tablets in 24hrs. Do not take use more then 2 to 3 times in a week. Continue Zonisamide and Neurontin Conside MRI if needed in the future  Migraine Headache A migraine headache is an intense, throbbing pain on one side or both sides of the head. Migraine headaches may also cause other symptoms, such as nausea, vomiting, and sensitivity to light and noise. A migraine headache can last from 4 hours to 3 days. Talk with your doctor about what things may bring on (trigger) your migraine headaches. What are the causes? The exact cause of this condition is not known. However, a migraine may be caused when nerves in the brain become irritated and release chemicals that cause inflammation of blood vessels. This inflammation causes pain. This condition may be triggered or caused by: Drinking alcohol. Smoking. Taking medicines, such as: Medicine used to treat chest pain (nitroglycerin). Birth control pills. Estrogen. Certain blood pressure medicines. Eating or drinking products that contain nitrates, glutamate, aspartame, or tyramine. Aged cheeses, chocolate, or caffeine may also be triggers. Doing physical activity. Other things that may trigger a migraine headache include: Menstruation. Pregnancy. Hunger. Stress. Lack of sleep or too much sleep. Weather changes. Fatigue. What increases the risk? The following factors may make you more likely to experience migraine headaches: Being a certain age. This condition is more common in people who are 30-74 years old. Being female. Having a family history of migraine headaches. Being Caucasian. Having a mental health condition, such as depression or anxiety. Being obese. What are the signs or symptoms? The main symptom of this condition is pulsating or  throbbing pain. This pain may: Happen in any area of the head, such as on one side or both sides. Interfere with daily activities. Get worse with physical activity. Get worse with exposure to bright lights or loud noises. Other symptoms may include: Nausea. Vomiting. Dizziness. General sensitivity to bright lights, loud noises, or smells. Before you get a migraine headache, you may get warning signs (an aura). An aura may include: Seeing flashing lights or having blind spots. Seeing bright spots, halos, or zigzag lines. Having tunnel vision or blurred vision. Having numbness or a tingling feeling. Having trouble talking. Having muscle weakness. Some people have symptoms after a migraine headache (postdromal phase), such as: Feeling tired. Difficulty concentrating. How is this diagnosed? A migraine headache can be diagnosed based on: Your symptoms. A physical exam. Tests, such as: CT scan or an MRI of the head. These imaging tests can help rule out other causes of headaches. Taking fluid from the spine (lumbar puncture) and analyzing it (cerebrospinal fluid analysis, or CSF analysis). How is this treated? This condition may be treated with medicines that: Relieve pain. Relieve nausea. Prevent migraine headaches. Treatment for this condition may also include: Acupuncture. Lifestyle changes like avoiding foods that trigger migraine headaches. Biofeedback. Cognitive behavioral therapy. Follow these instructions at home: Medicines Take over-the-counter and prescription medicines only as told by your health care provider. Ask your health care provider if the medicine prescribed to you: Requires you to avoid driving or using heavy machinery. Can cause constipation. You may need to take these actions to prevent or treat constipation: Drink enough fluid to keep your urine pale yellow. Take over-the-counter or prescription medicines. Eat foods that are high in fiber, such  as beans,  whole grains, and fresh fruits and vegetables. Limit foods that are high in fat and processed sugars, such as fried or sweet foods. Lifestyle Do not drink alcohol. Do not use any products that contain nicotine or tobacco, such as cigarettes, e-cigarettes, and chewing tobacco. If you need help quitting, ask your health care provider. Get at least 8 hours of sleep every night. Find ways to manage stress, such as meditation, deep breathing, or yoga. General instructions     Keep a journal to find out what may trigger your migraine headaches. For example, write down: What you eat and drink. How much sleep you get. Any change to your diet or medicines. If you have a migraine headache: Avoid things that make your symptoms worse, such as bright lights. It may help to lie down in a dark, quiet room. Do not drive or use heavy machinery. Ask your health care provider what activities are safe for you while you are experiencing symptoms. Keep all follow-up visits as told by your health care provider. This is important. Contact a health care provider if: You develop symptoms that are different or more severe than your usual migraine headache symptoms. You have more than 15 headache days in one month. Get help right away if: Your migraine headache becomes severe. Your migraine headache lasts longer than 72 hours. You have a fever. You have a stiff neck. You have vision loss. Your muscles feel weak or like you cannot control them. You start to lose your balance often. You have trouble walking. You faint. You have a seizure. Summary A migraine headache is an intense, throbbing pain on one side or both sides of the head. Migraines may also cause other symptoms, such as nausea, vomiting, and sensitivity to light and noise. This condition may be treated with medicines and lifestyle changes. You may also need to avoid certain things that trigger a migraine headache. Keep a journal to find out what  may trigger your migraine headaches. Contact your health care provider if you have more than 15 headache days in a month or you develop symptoms that are different or more severe than your usual migraine headache symptoms. This information is not intended to replace advice given to you by your health care provider. Make sure you discuss any questions you have with your healthcare provider. Document Revised: 06/15/2018 Document Reviewed: 04/05/2018 Elsevier Patient Education  2022 [employer].

## 2020-08-24 ENCOUNTER — Other Ambulatory Visit: Payer: Self-pay | Admitting: Neurology

## 2020-08-24 ENCOUNTER — Telehealth: Payer: Self-pay | Admitting: Neurology

## 2020-08-24 DIAGNOSIS — G43009 Migraine without aura, not intractable, without status migrainosus: Secondary | ICD-10-CM

## 2020-08-24 DIAGNOSIS — G43C0 Periodic headache syndromes in child or adult, not intractable: Secondary | ICD-10-CM

## 2020-08-24 MED ORDER — ELETRIPTAN HYDROBROMIDE 40 MG PO TABS: 40.0000 mg | ORAL_TABLET | ORAL | 11 refills | Status: DC | PRN

## 2020-08-24 MED ORDER — GABAPENTIN 100 MG PO CAPS: 100.0000 mg | ORAL_CAPSULE | Freq: Two times a day (BID) | ORAL | 5 refills | Status: DC

## 2020-08-24 MED ORDER — ZONISAMIDE 25 MG PO CAPS: 25.0000 mg | ORAL_CAPSULE | Freq: Every day | ORAL | 4 refills | Status: DC

## 2020-08-24 NOTE — Telephone Encounter (Signed)
Refills have been sent to the pharmacy for the pt

## 2020-08-24 NOTE — Telephone Encounter (Signed)
Pt states these medications did not get called in  eletriptan (RELPAX) 40 MG tablet,gabapentin (NEURONTIN) 100 MG capsule & zonisamide (ZONEGRAN) 25 MG capsule to [facility] [phone]

## 2020-08-25 ENCOUNTER — Telehealth: Payer: Self-pay | Admitting: Neurology

## 2020-08-25 NOTE — Telephone Encounter (Signed)
PA submitted through [employer]MM/[employer] KEY: [id number] Will await determination

## 2020-08-27 ENCOUNTER — Other Ambulatory Visit: Payer: Self-pay

## 2020-08-27 ENCOUNTER — Telehealth: Payer: Self-pay | Admitting: *Deleted

## 2020-08-27 ENCOUNTER — Ambulatory Visit (AMBULATORY_SURGERY_CENTER): Payer: 59 | Admitting: *Deleted

## 2020-08-27 VITALS — Ht 64.0 in | Wt 267.0 lb

## 2020-08-27 DIAGNOSIS — Z1211 Encounter for screening for malignant neoplasm of colon: Secondary | ICD-10-CM

## 2020-08-27 MED ORDER — NA SULFATE-K SULFATE-MG SULF 17.5-3.13-1.6 GM/177ML PO SOLN: 1.0000 | Freq: Once | ORAL | 0 refills | Status: AC

## 2020-08-27 NOTE — Telephone Encounter (Signed)
Attempted to contact patient for virtual pre visit. Voice mailbox full, unable to leave message. Attempted work phone, no answer. Missed appointment letter mailed, colonoscopy cancelled.

## 2020-08-27 NOTE — Telephone Encounter (Signed)
Patient called in wondering why no contact for pre visit. Completed virtual pre visit. Instructions mailed.

## 2020-08-27 NOTE — Progress Notes (Signed)
Virtual pre visit completed. Instructions mailed.   No egg or soy allergy known to patient  No issues with past sedation with any surgeries or procedures Patient denies ever being told they had issues or difficulty with intubation  No FH of Malignant Hyperthermia No diet pills per patient No home 02 use per patient  No blood thinners per patient  Pt denies issues with constipation  No A fib or A flutter  EMMI video to pt or via MyChart  COVID 19 guidelines implemented in [place] today with Pt and RN  Pt is fully vaccinated  for Covid   Discussed with pt there will be an out-of-pocket cost for prep and that varies from $0 to 70 dollars   Due to the COVID-19 pandemic we are asking patients to follow certain guidelines.  Pt aware of COVID protocols and [facility] guidelines   

## 2020-09-01 ENCOUNTER — Telehealth: Payer: Self-pay | Admitting: Neurology

## 2020-09-01 NOTE — Telephone Encounter (Signed)
Pt is requesting refil for eletriptan (RELPAX) 40 MG tablet, pharmacy [facility] [phone] saying they need a clearance.

## 2020-09-01 NOTE — Telephone Encounter (Signed)
PA was submitted to [employer] on 08/25/20 Received this message from cover my meds: If [employer] has not replied within 24 hours for urgent requests or within 48 hours for standard requests, please contact [employer] at [phone].

## 2020-09-02 NOTE — Telephone Encounter (Signed)
Spoke with [employer] representative at [phone].  I was told they had the PA on hold because they are waiting on more information from the office however I do not see where we have received any request for further information.  I was able to provide this over the phone.  Patient has tried and failed sumatriptan, rizatriptan, zolmitriptan.  I was told to allow 24 to 72 hours for a determination. I tried to call the pt but could not connect. I tried to call again but it went to VM. If the patient calls back, please let her know that I had to submit additional information to the insurance company. It will be 24-72 hours before we hear back.

## 2020-09-02 NOTE — Telephone Encounter (Signed)
Per Cover [employer] Med[employer], Eletriptan has been approved.   The request has been approved. The authorization is effective from 09/02/2020 to 09/01/2021, as long as the member is enrolled in their current health plan. The request was reviewed and approved by a licensed clinical pharmacist. A written notification letter will follow with additional details.  Approval letter faxed to pharmacy. Received a receipt of confirmation. Sent pt a Wellsite geologist.

## 2020-09-02 NOTE — Telephone Encounter (Signed)
I called the pt and LVM on mobile (ok per DPR) advising we had to provide additional information to the insurance company for the eletriptan.  We are now waiting to hear back for 1 to 3 days.  Advised patient she can give [place] a call tomorrow afternoon if she has not heard anything.

## 2020-09-11 ENCOUNTER — Ambulatory Visit (AMBULATORY_SURGERY_CENTER): Payer: 59 | Admitting: Internal Medicine

## 2020-09-11 ENCOUNTER — Encounter: Payer: Self-pay | Admitting: Internal Medicine

## 2020-09-11 ENCOUNTER — Other Ambulatory Visit: Payer: Self-pay

## 2020-09-11 VITALS — BP 109/64 | HR 60 | Temp 97.1°F | Resp 16 | Ht 64.0 in | Wt 267.0 lb

## 2020-09-11 DIAGNOSIS — Z1211 Encounter for screening for malignant neoplasm of colon: Secondary | ICD-10-CM

## 2020-09-11 MED ORDER — SODIUM CHLORIDE 0.9 % IV SOLN: 500.0000 mL | INTRAVENOUS | Status: DC

## 2020-09-11 NOTE — Progress Notes (Signed)
A and O x3. Report to RN. Tolerated MAC anesthesia well. 

## 2020-09-11 NOTE — Op Note (Signed)
[facility] Patient Name: Tiffany Jones Procedure Date: 09/11/2020 2:12 PM MRN: [id number] Endoscopist: Beverley Fiedler , MD Age: 55 Referring MD:  Date of Birth: 1965/12/24 Gender: Female Account #: [id number] Procedure:                Colonoscopy Indications:              Screening for colorectal malignant neoplasm, This                            is the patient's first colonoscopy Medicines:                Monitored Anesthesia Care Procedure:                Pre-Anesthesia Assessment:                           - Prior to the procedure, a History and Physical                            was performed, and patient medications and                            allergies were reviewed. The patient's tolerance of                            previous anesthesia was also reviewed. The risks                            and benefits of the procedure and the sedation                            options and risks were discussed with the patient.                            All questions were answered, and informed consent                            was obtained. Prior Anticoagulants: The patient has                            taken no previous anticoagulant or antiplatelet                            agents. ASA Grade Assessment: III - A patient with                            severe systemic disease. After reviewing the risks                            and benefits, the patient was deemed in                            satisfactory condition to undergo the procedure.  After obtaining informed consent, the colonoscope                            was passed under direct vision. Throughout the                            procedure, the patient's blood pressure, pulse, and                            oxygen saturations were monitored continuously. The                            CF [id number] #[id number] was introduced through the anus                            and advanced to  the cecum, identified by palpation.                            The colonoscopy was performed without difficulty.                            The patient tolerated the procedure well. The                            quality of the bowel preparation was excellent. The                            ileocecal valve, appendiceal orifice, and rectum                            were photographed. Scope In: 2:18:50 PM Scope Out: 2:30:05 PM Scope Withdrawal Time: 0 hours 8 minutes 53 seconds  Total Procedure Duration: 0 hours 11 minutes 15 seconds  Findings:                 The digital rectal exam was normal.                           The entire examined colon appeared normal on direct                            and retroflexion views. Complications:            No immediate complications. Estimated Blood Loss:     Estimated blood loss: none. Impression:               - The entire examined colon is normal on direct and                            retroflexion views.                           - No specimens collected. Recommendation:           - Patient has a contact number available for  emergencies. The signs and symptoms of potential                            delayed complications were discussed with the                            patient. Return to normal activities tomorrow.                            Written discharge instructions were provided to the                            patient.                           - Resume previous diet.                           - Continue present medications.                           - Repeat colonoscopy in 10 years for screening                            purposes. Beverley Fiedler, MD 09/11/2020 2:32:24 PM This report has been signed electronically.

## 2020-09-11 NOTE — Patient Instructions (Signed)
Please read handouts provided. ?Continue present medications. ?Repeat colonoscopy in 10 years for screening. ? ? ?YOU HAD AN ENDOSCOPIC PROCEDURE TODAY AT THE [facility]:   Refer to the procedure report that was given to you for any specific questions about what was found during the examination.  If the procedure report does not answer your questions, please call your gastroenterologist to clarify.  If you requested that your care partner not be given the details of your procedure findings, then the procedure report has been included in a sealed envelope for you to review at your convenience later. ? ?YOU SHOULD EXPECT: Some feelings of bloating in the abdomen. Passage of more gas than usual.  Walking can help get rid of the air that was put into your GI tract during the procedure and reduce the bloating. If you had a lower endoscopy (such as a colonoscopy or flexible sigmoidoscopy) you may notice spotting of blood in your stool or on the toilet paper. If you underwent a bowel prep for your procedure, you may not have a normal bowel movement for a few days. ? ?Please Note:  You might notice some irritation and congestion in your nose or some drainage.  This is from the oxygen used during your procedure.  There is no need for concern and it should clear up in a day or so. ? ?SYMPTOMS TO REPORT IMMEDIATELY: ? ?Following lower endoscopy (colonoscopy or flexible sigmoidoscopy): ? Excessive amounts of blood in the stool ? Significant tenderness or worsening of abdominal pains ? Swelling of the abdomen that is new, acute ? Fever of 100?F or higher ? ? ?For urgent or emergent issues, a gastroenterologist can be reached at any hour by calling [phone]. ?Do not use MyChart messaging for urgent concerns.  ? ? ?DIET:  We do recommend a small meal at first, but then you may proceed to your regular diet.  Drink plenty of fluids but you should avoid alcoholic beverages for 24 hours. ? ?ACTIVITY:  You should  plan to take it easy for the rest of today and you should NOT DRIVE or use heavy machinery until tomorrow (because of the sedation medicines used during the test).   ? ?FOLLOW UP: ?Our staff will call the number listed on your records 48-72 hours following your procedure to check on you and address any questions or concerns that you may have regarding the information given to you following your procedure. If we do not reach you, we will leave a message.  We will attempt to reach you two times.  During this call, we will ask if you have developed any symptoms of COVID 19. If you develop any symptoms (ie: fever, flu-like symptoms, shortness of breath, cough etc.) before then, please call [phone].  If you test positive for Covid 19 in the 2 weeks post procedure, please call and report this information to us.   ? ?If any biopsies were taken you will be contacted by phone or by letter within the next 1-3 weeks.  Please call us at [phone] if you have not heard about the biopsies in 3 weeks.  ? ? ?SIGNATURES/CONFIDENTIALITY: ?You and/or your care partner have signed paperwork which will be entered into your electronic medical record.  These signatures attest to the fact that that the information above on your After Visit Summary has been reviewed and is understood.  Full responsibility of the confidentiality of this discharge information lies with you and/or your care-partner.  ?

## 2020-09-11 NOTE — Progress Notes (Signed)
VS by CW  Pt's states no medical or surgical changes since previsit or office visit.  

## 2020-09-15 ENCOUNTER — Telehealth: Payer: Self-pay

## 2020-09-15 NOTE — Telephone Encounter (Signed)
LVM

## 2020-09-15 NOTE — Telephone Encounter (Signed)
First attempt follow up call to pt, lm on vm 

## 2020-09-25 ENCOUNTER — Telehealth: Payer: Self-pay | Admitting: Hematology and Oncology

## 2020-09-25 NOTE — Telephone Encounter (Signed)
Scheduled appt per referral. Pt aware.  

## 2020-10-07 ENCOUNTER — Other Ambulatory Visit: Payer: Self-pay

## 2020-10-07 ENCOUNTER — Encounter: Payer: Self-pay | Admitting: Hematology and Oncology

## 2020-10-07 ENCOUNTER — Inpatient Hospital Stay: Payer: 59 | Attending: Hematology and Oncology | Admitting: Hematology and Oncology

## 2020-10-07 VITALS — BP 116/66 | HR 68 | Temp 97.0°F | Resp 20 | Wt 271.1 lb

## 2020-10-07 DIAGNOSIS — Z9189 Other specified personal risk factors, not elsewhere classified: Secondary | ICD-10-CM

## 2020-10-07 DIAGNOSIS — Z8249 Family history of ischemic heart disease and other diseases of the circulatory system: Secondary | ICD-10-CM | POA: Diagnosis not present

## 2020-10-07 DIAGNOSIS — N6091 Unspecified benign mammary dysplasia of right breast: Secondary | ICD-10-CM | POA: Diagnosis present

## 2020-10-07 DIAGNOSIS — Z836 Family history of other diseases of the respiratory system: Secondary | ICD-10-CM | POA: Diagnosis not present

## 2020-10-07 DIAGNOSIS — R232 Flushing: Secondary | ICD-10-CM | POA: Diagnosis not present

## 2020-10-07 DIAGNOSIS — Z803 Family history of malignant neoplasm of breast: Secondary | ICD-10-CM | POA: Diagnosis not present

## 2020-10-07 DIAGNOSIS — Z811 Family history of alcohol abuse and dependence: Secondary | ICD-10-CM | POA: Diagnosis not present

## 2020-10-07 DIAGNOSIS — K219 Gastro-esophageal reflux disease without esophagitis: Secondary | ICD-10-CM | POA: Diagnosis not present

## 2020-10-07 DIAGNOSIS — Z79899 Other long term (current) drug therapy: Secondary | ICD-10-CM | POA: Insufficient documentation

## 2020-10-07 DIAGNOSIS — Z808 Family history of malignant neoplasm of other organs or systems: Secondary | ICD-10-CM | POA: Insufficient documentation

## 2020-10-07 DIAGNOSIS — Z882 Allergy status to sulfonamides status: Secondary | ICD-10-CM | POA: Diagnosis not present

## 2020-10-07 NOTE — Progress Notes (Signed)
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  Pathology review: I discussed pathogenesis of atypical ductal hyperplasia, DCIS and invasive breast cancer. I explained to her that atypical ductal hyperplasia  is characterized by a proliferation of uniform epithelial cells filling part, but not the entirety, of the involved duct. ADH is associated with an increased risk of both ipsilateral and contralateral breast cancer and thus provides evidence of underlying breast abnormalities that predispose to breast cancer.   Prognosis:Using the TC model calculated, her lifetime risk of breast cancer is about 34%  I discussed the risks and benefits of tamoxifen therapy. I believe that the risks far outweigh any benefits from tamoxifen.  We have discussed about adverse effects of tamoxifen including but not limited to postmenopausal symptoms, increased risk of DVT/PE, increased risk of endometrial cancer, benefit would be improvement in bone density.  She is not interested in pursuing tamoxifen at this time.  She says she has enough going on and she does not want any additional side effects.  We have also discussed about lifestyle interventions to reduce her risk of breast cancer including the following 1. Exercise 30 minutes daily or 150 minutes a week. 2. Weight loss 3. Increasing fruits and vegetables and less red meat 4.  Self breast exam monthly.   I have  discussed about annual MRI in addition to mammogram, discussed about unknown risk of long-term gadolinium use as well as increased biopsies with the regular MRI use.  She does not want to do any MRIs at this time.  She says she will stick to mammograms.  She understands the benefits but she would like to continue annual mammograms, follow-up with high risk breast clinic every 6 months.  Surveillance plan: Annual mammograms and breast exams  HISTORY OF PRESENTING ILLNESS:   Tiffany Jones 55 y.o. female is here because of high risk for breast cancer.  This is a pleasant 55 year old female patient with atypical ductal hyperplasia noted on surgical specimen during right lumpectomy referred to high risk breast clinic for additional recommendations. She arrived to the appointment today by herself.  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  She had a recent follow-up with Dr. Magnus Ivan, her surgeon when the referral was placed.  She feels quite well except for some postop seroma changes in the right breast.  She does report family history of breast cancer in her mom and her sister.  Both had breast cancer in early 46s, her mom died at 20 from breast cancer.  Her sister also had breast cancer in her early 2s, she died from an unrelated cause in her late 59s. She denies any hormone replacement therapy, last menstrual period more than a year ago. She has some postmenopausal symptoms like hot flashes and mood swings currently.  Rest of the pertinent 10 point ROS reviewed and negative.  REVIEW OF SYSTEMS:   Constitutional:  Denies fevers, chills or abnormal night sweats Eyes: Denies blurriness of vision, double vision or watery eyes Ears, nose, mouth, throat, and face: Denies mucositis or sore throat Respiratory: Denies cough, dyspnea or  wheezes Cardiovascular: Denies palpitation, chest discomfort or lower extremity swelling Gastrointestinal:  Denies nausea, heartburn or change in bowel habits Skin: Denies abnormal skin rashes Lymphatics: Denies new lymphadenopathy or easy bruising Neurological:Denies numbness, tingling or new weaknesses Behavioral/Psych: Mood is stable, no new changes  All other systems were reviewed with the patient and are negative.  MEDICAL HISTORY:  Past Medical History:  Diagnosis Date   Anemia    Asthma    Enlarged heart    Gastroesophageal reflux disease without esophagitis 05/17/2018   GERD (gastroesophageal reflux disease)    History of abnormal mammogram    Migraine without status migrainosus, not intractable 05/17/2018   Migraines    Pre-diabetes    Sleep disturbance 05/17/2018    SURGICAL HISTORY: Past Surgical History:  Procedure Laterality Date   BREAST LUMPECTOMY WITH RADIOACTIVE SEED LOCALIZATION Right 05/27/2020   Procedure: RIGHT BREAST LUMPECTOMY WITH RADIOACTIVE SEED LOCALIZATION;  Surgeon: Abigail Miyamoto, MD;  Location: [facility];  Service: General;  Laterality: Right;  LMA; START TIME OF 11:00 AM FOR 60 MINUTES ROOM 2   DILATION AND CURETTAGE OF UTERUS     ECTOPIC PREGNANCY SURGERY     Right breast biopsy     WISDOM TOOTH EXTRACTION      SOCIAL HISTORY: Social History   Socioeconomic History   Marital status: Widowed    Spouse name: Not on file   Number of children: 4   Years of education: 12   Highest education level: High school graduate  Occupational History   Not on file  Tobacco Use   Smoking status: Never   Smokeless tobacco: Never  Vaping Use   Vaping Use: Never used  Substance and Sexual Activity   Alcohol use: Yes    Comment: socially   Drug use: Never   Sexual activity: Not Currently    Birth control/protection: Post-menopausal  Other Topics Concern   Not on file  Social History Narrative   Lives with 2 son   Left handed    Caffeine: coffee 2 cups a day and about 2 other caffeine products a week.    Social Determinants of Health   Financial Resource Strain: Not on file  Food Insecurity: Not on file  Transportation Needs: Not on file  Physical Activity: Not on file  Stress: Not on file  Social Connections: Not on file  Intimate Partner Violence: Not on file    FAMILY HISTORY: Family History  Problem Relation Age of Onset   Breast cancer Mother 70   Hypertension Father    Alcohol abuse Father    Lymphoma Maternal Grandfather    Asthma Son    Colon polyps Neg Hx    Colon cancer Neg Hx    Esophageal cancer Neg Hx    Rectal cancer Neg Hx    Stomach cancer Neg Hx     ALLERGIES:  is allergic to sulfa antibiotics.  MEDICATIONS:  Current Outpatient Medications  Medication Sig Dispense Refill   albuterol (PROVENTIL) (2.5 MG/3ML) 0.083% nebulizer solution Take 3 mLs (2.5 mg total) by nebulization every 6 (six) hours as needed for wheezing or shortness of breath. 150 mL 0   albuterol (VENTOLIN HFA) 108 (90 Base) MCG/ACT inhaler Inhale 2 puffs into the lungs every 6 (six) hours as needed for wheezing or  shortness of breath. 18 g prn   eletriptan (RELPAX) 40 MG tablet Take 1 tablet (40 mg total) by mouth as needed for migraine or headache. May repeat in 2 hours if headache persists or recurs. 9 tablet 11   fluticasone (FLOVENT HFA) 110 MCG/ACT inhaler Inhale 2 puffs into the lungs 2 (two) times daily. 1 each 11   gabapentin (NEURONTIN) 100 MG capsule Take 1 capsule (100 mg total) by mouth 2 (two) times daily. 60 capsule 5   loratadine (CLARITIN) 10 MG tablet Take 10 mg by mouth daily.     omeprazole (PRILOSEC) 40 MG capsule Take 1 capsule (40 mg total) by mouth daily. To decrease stomach acid 30 capsule 5   zonisamide (ZONEGRAN) 25 MG capsule Take 1 capsule (25 mg total) by mouth daily. 30 capsule 4   fluticasone (FLONASE) 50 MCG/ACT nasal spray Place 1-2 sprays into both nostrils daily for 7 days. 1 g 0    ondansetron (ZOFRAN-ODT) 4 MG disintegrating tablet Take 1-2 tablets (4-8 mg total) by mouth every 8 (eight) hours as needed. For migraine and/or nausea. 30 tablet 3   No current facility-administered medications for this visit.     PHYSICAL EXAMINATION:  ECOG PERFORMANCE STATUS: 0 - Asymptomatic  Vitals:   10/07/20 1354  BP: 116/66  Pulse: 68  Resp: 20  Temp: (!) 97 F (36.1 C)  SpO2: 99%   Filed Weights   10/07/20 1354  Weight: 271 lb 1.6 oz (123 kg)    Physical exam deferred in lieu of counseling, patient recently had a breast exam by Dr. Magnus Ivan about 2 months ago.  LABORATORY DATA:  I have reviewed the data as listed Lab Results  Component Value Date   WBC 9.3 06/04/2020   HGB 11.5 06/04/2020   HCT 35.6 06/04/2020   MCV 89 06/04/2020   PLT 314 06/04/2020     Chemistry      Component Value Date/Time   NA 140 06/04/2020 1631   K 4.5 06/04/2020 1631   CL 105 06/04/2020 1631   CO2 22 06/04/2020 1631   BUN 13 06/04/2020 1631   CREATININE 0.78 06/04/2020 1631      Component Value Date/Time   CALCIUM 9.8 06/04/2020 1631   ALKPHOS 79 06/04/2020 1631   AST 12 06/04/2020 1631   ALT 12 06/04/2020 1631   BILITOT 0.3 06/04/2020 1631     I have reviewed pertinent pathology reports.  RADIOGRAPHIC STUDIES: I have personally reviewed the radiological images as listed and agreed with the findings in the report. No results found.  All questions were answered. The patient knows to call the clinic with any problems, questions or concerns. I spent 45 minutes in the care of this patient including H and P, review of records, counseling and coordination of care.     Rachel Moulds, MD 10/07/2020 2:22 PM

## 2020-10-17 ENCOUNTER — Other Ambulatory Visit: Payer: Self-pay

## 2020-10-17 ENCOUNTER — Ambulatory Visit (HOSPITAL_COMMUNITY)
Admission: EM | Admit: 2020-10-17 | Discharge: 2020-10-17 | Disposition: A | Discharge status: Home / Self Care | Payer: 59 | Attending: Student | Admitting: Student

## 2020-10-17 ENCOUNTER — Encounter (HOSPITAL_COMMUNITY): Payer: Self-pay | Admitting: Emergency Medicine

## 2020-10-17 DIAGNOSIS — A059 Bacterial foodborne intoxication, unspecified: Secondary | ICD-10-CM | POA: Diagnosis not present

## 2020-10-17 MED ORDER — FAMOTIDINE 20 MG PO TABS: 20.0000 mg | ORAL_TABLET | Freq: Two times a day (BID) | ORAL | 0 refills | Status: AC

## 2020-10-17 MED ORDER — LOPERAMIDE HCL 2 MG PO CAPS: 2.0000 mg | ORAL_CAPSULE | Freq: Four times a day (QID) | ORAL | 0 refills | Status: AC | PRN

## 2020-10-17 MED ORDER — SIMETHICONE 125 MG PO TABS: 1.0000 | ORAL_TABLET | Freq: Two times a day (BID) | ORAL | 0 refills | Status: AC | PRN

## 2020-10-17 NOTE — Discharge Instructions (Addendum)
-  Continue Prilosec -Take the Imodium (loperamide) up to 4 times daily for diarrhea. -Pepcid (famotidine) with breakfast and dinner while symptoms persist -Simethicone as needed for gas pain -Drink plenty of fluids and eat a bland diet

## 2020-10-17 NOTE — ED Triage Notes (Signed)
Upper abdominal cramping since Tuesday.  Patient has had loose stools.  2 soft stools today, but continues having cramping, foul odor different than usual.

## 2020-10-17 NOTE — ED Provider Notes (Signed)
Days ago [name]    CSN: [id number] Arrival date & time: 10/17/20  1043      History   Chief Complaint Chief Complaint  Patient presents with   Abdominal Pain    HPI Tiffany Jones is a 55 y.o. female presenting with stomach issue. Medical history GERD, prediabetes, migraines, asthma. Taking prilosec for GERD.  Patient states that on 5 days ago, she ate some pork that had been sitting on the counter all night; the next day she developed crampy epigastric pain, worse with eating.  Initially with many episodes of watery diarrhea daily, she states that now she only had 2 episodes of soft brown stool today.  Denies any nausea or vomiting, she is tolerating foods and fluids without issue.  Denies radiation of the pain to right or left upper quadrants or back.  Feeling well otherwise, denies fever/chills, recent URI, cough, congestion, urinary symptoms.  Denies epigastric burning.  Denies black or tarry stool, BRBPR.   HPI  Past Medical History:  Diagnosis Date   Anemia    Asthma    Enlarged heart    Gastroesophageal reflux disease without esophagitis 05/17/2018   GERD (gastroesophageal reflux disease)    History of abnormal mammogram    Migraine without status migrainosus, not intractable 05/17/2018   Migraines    Pre-diabetes    Sleep disturbance 05/17/2018    Patient Active Problem List   Diagnosis Date Noted   Migraine without status migrainosus, not intractable 05/17/2018   Gastroesophageal reflux disease without esophagitis 05/17/2018   Sleep disturbance 05/17/2018    Past Surgical History:  Procedure Laterality Date   BREAST LUMPECTOMY WITH RADIOACTIVE SEED LOCALIZATION Right 05/27/2020   Procedure: RIGHT BREAST LUMPECTOMY WITH RADIOACTIVE SEED LOCALIZATION;  Surgeon: Abigail Miyamoto, MD;  Location: [facility];  Service: General;  Laterality: Right;  LMA; START TIME OF 11:00 AM FOR 60 MINUTES ROOM 2   DILATION AND CURETTAGE OF UTERUS      ECTOPIC PREGNANCY SURGERY     Right breast biopsy     WISDOM TOOTH EXTRACTION      OB History   No obstetric history on file.      Home Medications    Prior to Admission medications   Medication Sig Start Date End Date Taking? Authorizing Provider  famotidine (PEPCID) 20 MG tablet Take 1 tablet (20 mg total) by mouth 2 (two) times daily. With breakfast and dinner while symptoms persist 10/17/20  Yes Rhys Martini, PA-C  fluticasone (FLOVENT HFA) 110 MCG/ACT inhaler Inhale 2 puffs into the lungs 2 (two) times daily. 06/04/20  Yes Georgian Co M, PA-C  gabapentin (NEURONTIN) 100 MG capsule Take 1 capsule (100 mg total) by mouth 2 (two) times daily. 08/24/20  Yes Anson Fret, MD  loperamide (IMODIUM) 2 MG capsule Take 1 capsule (2 mg total) by mouth 4 (four) times daily as needed for diarrhea or loose stools. 10/17/20  Yes Rhys Martini, PA-C  omeprazole (PRILOSEC) 40 MG capsule Take 1 capsule (40 mg total) by mouth daily. To decrease stomach acid 06/04/20  Yes Anders Simmonds, PA-C  Simethicone 125 MG TABS Take 1 tablet (125 mg total) by mouth 2 (two) times daily as needed. As needed for gas pain. Avoid taking within 1 hour of taking other medications. 10/17/20  Yes Rhys Martini, PA-C  zonisamide (ZONEGRAN) 25 MG capsule Take 1 capsule (25 mg total) by mouth daily. 08/24/20  Yes Anson Fret, MD  albuterol (PROVENTIL) (  2.5 MG/3ML) 0.083% nebulizer solution Take 3 mLs (2.5 mg total) by nebulization every 6 (six) hours as needed for wheezing or shortness of breath. 06/10/19   Rema Fendt, NP  albuterol (VENTOLIN HFA) 108 (90 Base) MCG/ACT inhaler Inhale 2 puffs into the lungs every 6 (six) hours as needed for wheezing or shortness of breath. 06/04/20   Anders Simmonds, PA-C  eletriptan (RELPAX) 40 MG tablet Take 1 tablet (40 mg total) by mouth as needed for migraine or headache. May repeat in 2 hours if headache persists or recurs. 08/24/20   Anson Fret, MD  fluticasone  (FLONASE) 50 MCG/ACT nasal spray Place 1-2 sprays into both nostrils daily for 7 days. 11/23/19 08/27/20  Wieters, Hallie C, PA-C  loratadine (CLARITIN) 10 MG tablet Take 10 mg by mouth daily.    [provider]  ondansetron (ZOFRAN-ODT) 4 MG disintegrating tablet Take 1-2 tablets (4-8 mg total) by mouth every 8 (eight) hours as needed. For migraine and/or nausea. 08/19/20   Anson Fret, MD    Family History Family History  Problem Relation Age of Onset   Breast cancer Mother 12   Hypertension Father    Alcohol abuse Father    Lymphoma Maternal Grandfather    Asthma Son    Colon polyps Neg Hx    Colon cancer Neg Hx    Esophageal cancer Neg Hx    Rectal cancer Neg Hx    Stomach cancer Neg Hx     Social History Social History   Tobacco Use   Smoking status: Never   Smokeless tobacco: Never  Vaping Use   Vaping Use: Never used  Substance Use Topics   Alcohol use: Yes    Comment: socially   Drug use: Never     Allergies   Sulfa antibiotics   Review of Systems Review of Systems  Constitutional:  Negative for appetite change, chills, diaphoresis, fever and unexpected weight change.  HENT:  Negative for congestion, ear pain, sinus pressure, sinus pain, sneezing, sore throat and trouble swallowing.   Respiratory:  Negative for cough, chest tightness and shortness of breath.   Cardiovascular:  Negative for chest pain.  Gastrointestinal:  Positive for abdominal pain and diarrhea. Negative for abdominal distention, anal bleeding, blood in stool, constipation, nausea, rectal pain and vomiting.  Genitourinary:  Negative for dysuria, flank pain, frequency and urgency.  Musculoskeletal:  Negative for back pain and myalgias.  Neurological:  Negative for dizziness, light-headedness and headaches.  All other systems reviewed and are negative.   Physical Exam Triage Vital Signs ED Triage Vitals  Enc Vitals Group     BP      Pulse      Resp      Temp      Temp src       SpO2      Weight      Height      Head Circumference      Peak Flow      Pain Score      Pain Loc      Pain Edu?      Excl. in GC?    No data found.  Updated Vital Signs BP 119/68 (BP Location: Left Arm) Comment (BP Location): large cuff  Pulse 64   Temp 98.4 F (36.9 C) (Oral)   Resp (!) 21   LMP 07/18/2017   SpO2 100%   Visual Acuity Right Eye Distance:   Left Eye Distance:   Bilateral  Distance:    Right Eye Near:   Left Eye Near:    Bilateral Near:     Physical Exam Vitals reviewed.  Constitutional:      General: She is not in acute distress.    Appearance: Normal appearance. She is obese. She is not ill-appearing.  HENT:     Head: Normocephalic and atraumatic.     Mouth/Throat:     Mouth: Mucous membranes are moist.     Comments: Moist mucous membranes Eyes:     Extraocular Movements: Extraocular movements intact.     Pupils: Pupils are equal, round, and reactive to light.  Cardiovascular:     Rate and Rhythm: Normal rate and regular rhythm.     Heart sounds: Normal heart sounds.  Pulmonary:     Effort: Pulmonary effort is normal.     Breath sounds: Normal breath sounds. No wheezing, rhonchi or rales.  Abdominal:     General: Bowel sounds are increased. There is no distension.     Palpations: Abdomen is soft. There is no mass.     Tenderness: There is no abdominal tenderness. There is no right CVA tenderness, left CVA tenderness, guarding or rebound. Negative signs include Murphy's sign, Rovsing's sign and McBurney's sign.     Comments: BS increased throughout No reproducible abd pain  Skin:    General: Skin is warm.     Capillary Refill: Capillary refill takes less than 2 seconds.     Comments: Good skin turgor  Neurological:     General: No focal deficit present.     Mental Status: She is alert and oriented to person, place, and time.  Psychiatric:        Mood and Affect: Mood normal.        Behavior: Behavior normal.     UC Treatments /  Results  Labs (all labs ordered are listed, but only abnormal results are displayed) Labs Reviewed - No data to display  EKG   Radiology No results found.  Procedures Procedures (including critical care time)  Medications Ordered in UC Medications - No data to display  Initial Impression / Assessment and Plan / UC Course  I have reviewed the triage vital signs and the nursing notes.  Pertinent labs & imaging results that were available during my care of the patient were reviewed by me and considered in my medical decision making (see chart for details).     This patient is a very pleasant 55 y.o. year old female presenting with food poisoning x4 days, improving on its own. Afebrile, nontachy. Appears well hydrated today. Pepcid, simethicone, imodium. Continue prilosec for GERD. Good hydration, BRAT diet. ED return precautions discussed. Patient verbalizes understanding and agreement.    Final Clinical Impressions(s) / UC Diagnoses   Final diagnoses:  Food poisoning     Discharge Instructions      -Continue Prilosec -Take the Imodium (loperamide) up to 4 times daily for diarrhea. -Pepcid (famotidine) with breakfast and dinner while symptoms persist -Simethicone as needed for gas pain -Drink plenty of fluids and eat a bland diet     ED Prescriptions     Medication Sig Dispense Auth. Provider   famotidine (PEPCID) 20 MG tablet Take 1 tablet (20 mg total) by mouth 2 (two) times daily. With breakfast and dinner while symptoms persist 30 tablet Rhys Martini, PA-C   Simethicone 125 MG TABS Take 1 tablet (125 mg total) by mouth 2 (two) times daily as needed. As needed for gas  pain. Avoid taking within 1 hour of taking other medications. 21 tablet Rhys MartiniGraham, Lennon Richins E, PA-C   loperamide (IMODIUM) 2 MG capsule Take 1 capsule (2 mg total) by mouth 4 (four) times daily as needed for diarrhea or loose stools. 12 capsule Rhys MartiniGraham, Jovonna Nickell E, PA-C      PDMP not reviewed this  encounter.   Rhys MartiniGraham, Donovon Micheletti E, PA-C 10/17/20 1340

## 2021-01-06 ENCOUNTER — Other Ambulatory Visit: Payer: Self-pay | Admitting: Physician Assistant

## 2021-01-06 DIAGNOSIS — K219 Gastro-esophageal reflux disease without esophagitis: Secondary | ICD-10-CM

## 2021-01-06 NOTE — Telephone Encounter (Signed)
Requested Prescriptions  Pending Prescriptions Disp Refills  . omeprazole (PRILOSEC) 40 MG capsule [Pharmacy Med Name: Omeprazole 40 MG Oral Capsule Delayed Release] 30 capsule 0    Sig: TAKE 1 CAPSULE BY MOUTH ONCE DAILY. TO DECREASE STOMACH ACID     Gastroenterology: Proton Pump Inhibitors Passed - 01/06/2021  5:27 PM      Passed - Valid encounter within last 12 months    Recent Outpatient Visits          7 months ago Hypertension, unspecified type   [facility] [place], [place], [place]   1 year ago Persistent cough for 3 weeks or longer   [facility] [place], [place], [place]   1 year ago Mild intermittent asthma without complication   [facility] [place], [place], [place]   1 year ago Moderate asthma with exacerbation, unspecified whether persistent    [facility] [place], [place], [place]   1 year ago Hypertension, unspecified type   [facility] [place], [place], [place]      Future Appointments            In 1 month Anson Fret, MD [facility]

## 2021-01-31 ENCOUNTER — Other Ambulatory Visit: Payer: Self-pay | Admitting: Physician Assistant

## 2021-01-31 DIAGNOSIS — K219 Gastro-esophageal reflux disease without esophagitis: Secondary | ICD-10-CM

## 2021-02-01 NOTE — Telephone Encounter (Signed)
Requested Prescriptions  Pending Prescriptions Disp Refills  . omeprazole (PRILOSEC) 40 MG capsule [Pharmacy Med Name: Omeprazole 40 MG Oral Capsule Delayed Release] 30 capsule 0    Sig: TAKE 1 CAPSULE BY MOUTH ONCE DAILY TO DECREASE STOMACH ACID.     Gastroenterology: Proton Pump Inhibitors Passed - 01/31/2021  2:40 AM      Passed - Valid encounter within last 12 months    Recent Outpatient Visits          8 months ago Hypertension, unspecified type   [facility] [place], [place], [place]   1 year ago Persistent cough for 3 weeks or longer   [facility] [place], [place], [place]   1 year ago Mild intermittent asthma without complication   [facility] [place], [place], [place]   1 year ago Moderate asthma with exacerbation, unspecified whether persistent   [facility] [place], [place], [place]   1 year ago Hypertension, unspecified type   [facility] [place], [place], [place]      Future Appointments            In 1 month Anson Fret, MD [facility]

## 2021-02-24 ENCOUNTER — Telehealth: Payer: 59 | Admitting: Neurology

## 2021-03-02 ENCOUNTER — Other Ambulatory Visit: Payer: Self-pay | Admitting: Physician Assistant

## 2021-03-02 DIAGNOSIS — K219 Gastro-esophageal reflux disease without esophagitis: Secondary | ICD-10-CM

## 2021-03-03 ENCOUNTER — Telehealth: Payer: Self-pay | Admitting: Neurology

## 2021-03-03 ENCOUNTER — Telehealth (INDEPENDENT_AMBULATORY_CARE_PROVIDER_SITE_OTHER): Payer: 59 | Admitting: Neurology

## 2021-03-03 DIAGNOSIS — G43009 Migraine without aura, not intractable, without status migrainosus: Secondary | ICD-10-CM

## 2021-03-03 DIAGNOSIS — Z91199 Patient's noncompliance with other medical treatment and regimen due to unspecified reason: Secondary | ICD-10-CM

## 2021-03-03 DIAGNOSIS — G43C0 Periodic headache syndromes in child or adult, not intractable: Secondary | ICD-10-CM

## 2021-03-03 MED ORDER — ONDANSETRON 4 MG PO TBDP: 4.0000 mg | ORAL_TABLET | Freq: Three times a day (TID) | ORAL | 3 refills | Status: DC | PRN

## 2021-03-03 MED ORDER — GABAPENTIN 100 MG PO CAPS: 100.0000 mg | ORAL_CAPSULE | Freq: Two times a day (BID) | ORAL | 0 refills | Status: DC

## 2021-03-03 MED ORDER — ELETRIPTAN HYDROBROMIDE 40 MG PO TABS: 40.0000 mg | ORAL_TABLET | ORAL | 3 refills | Status: DC | PRN

## 2021-03-03 MED ORDER — ZONISAMIDE 25 MG PO CAPS
25.0000 mg | ORAL_CAPSULE | Freq: Every day | ORAL | 0 refills | Status: DC
Start: 2021-03-03 — End: 2021-04-22

## 2021-03-03 NOTE — Telephone Encounter (Signed)
LVM reminding pt of MyChart Video Visit for today.

## 2021-03-03 NOTE — Telephone Encounter (Signed)
I tried calling patient at 250 in preparation for her 3pm mychart appointment. She did not answer I left a message, tried 2x. She nevre was oning for her mychart appointment. At 315 I received a message she locked herself out of mychart, I asked her to be rescheduled to NP. I can refill her med sin the meantime

## 2021-03-03 NOTE — Progress Notes (Signed)
I tried calling patient at 250 in preparation for her 3pm mychart appointment. She did not answer I left a message, tried 2x. She never was onine for her mychart appointment. At 315 I received a message she locked herself out of mychart, I asked her to be rescheduled to NP. I can refill her meds in the meantime   Meds ordered this encounter  Medications   ondansetron (ZOFRAN-ODT) 4 MG disintegrating tablet    Sig: Take 1-2 tablets (4-8 mg total) by mouth every 8 (eight) hours as needed. For migraine and/or nausea.    Dispense:  30 tablet    Refill:  3   eletriptan (RELPAX) 40 MG tablet    Sig: Take 1 tablet (40 mg total) by mouth as needed for migraine or headache. May repeat in 2 hours if headache persists or recurs.    Dispense:  9 tablet    Refill:  3   zonisamide (ZONEGRAN) 25 MG capsule    Sig: Take 1 capsule (25 mg total) by mouth daily.    Dispense:  90 capsule    Refill:  0   gabapentin (NEURONTIN) 100 MG capsule    Sig: Take 1 capsule (100 mg total) by mouth 2 (two) times daily.    Dispense:  180 capsule    Refill:  0

## 2021-03-15 ENCOUNTER — Encounter: Payer: Self-pay | Admitting: Neurology

## 2021-04-01 ENCOUNTER — Other Ambulatory Visit: Payer: Self-pay | Admitting: Physician Assistant

## 2021-04-01 DIAGNOSIS — K219 Gastro-esophageal reflux disease without esophagitis: Secondary | ICD-10-CM

## 2021-04-01 NOTE — Telephone Encounter (Signed)
Requesting early. Requested Prescriptions  Pending Prescriptions Disp Refills   omeprazole (PRILOSEC) 40 MG capsule [Pharmacy Med Name: Omeprazole 40 MG Oral Capsule Delayed Release] 30 capsule 0    Sig: TAKE 1 CAPSULE BY MOUTH ONCE DAILY TO  DECREASE  STOMACH  ACID     Gastroenterology: Proton Pump Inhibitors Passed - 04/01/2021  2:44 AM      Passed - Valid encounter within last 12 months    Recent Outpatient Visits          10 months ago Hypertension, unspecified type   [facility] [place], [place], [place]   1 year ago Persistent cough for 3 weeks or longer   [facility] [place], [place], [place]   1 year ago Mild intermittent asthma without complication   [facility] [place], [place], NP   1 year ago Moderate asthma with exacerbation, unspecified whether persistent   [facility] [place], [place], [place]   2 years ago Hypertension, unspecified type   [facility] [place], [place], NP

## 2021-04-07 ENCOUNTER — Inpatient Hospital Stay: Payer: Self-pay | Admitting: Hematology and Oncology

## 2021-04-22 ENCOUNTER — Ambulatory Visit (INDEPENDENT_AMBULATORY_CARE_PROVIDER_SITE_OTHER): Payer: 59 | Admitting: Adult Health

## 2021-04-22 ENCOUNTER — Other Ambulatory Visit: Payer: Self-pay

## 2021-04-22 ENCOUNTER — Encounter: Payer: Self-pay | Admitting: Adult Health

## 2021-04-22 VITALS — BP 127/70 | HR 66 | Ht 64.0 in | Wt 271.0 lb

## 2021-04-22 DIAGNOSIS — G43009 Migraine without aura, not intractable, without status migrainosus: Secondary | ICD-10-CM | POA: Diagnosis not present

## 2021-04-22 DIAGNOSIS — G43C0 Periodic headache syndromes in child or adult, not intractable: Secondary | ICD-10-CM

## 2021-04-22 MED ORDER — NURTEC 75 MG PO TBDP: 75.0000 mg | ORAL_TABLET | Freq: Every day | ORAL | 0 refills | Status: AC | PRN

## 2021-04-22 MED ORDER — ZONISAMIDE 25 MG PO CAPS: 25.0000 mg | ORAL_CAPSULE | Freq: Every day | ORAL | 3 refills | Status: AC

## 2021-04-22 MED ORDER — GABAPENTIN 100 MG PO CAPS: 100.0000 mg | ORAL_CAPSULE | Freq: Two times a day (BID) | ORAL | 3 refills | Status: AC

## 2021-04-22 MED ORDER — ONDANSETRON 4 MG PO TBDP: 4.0000 mg | ORAL_TABLET | Freq: Three times a day (TID) | ORAL | 11 refills | Status: AC | PRN

## 2021-04-22 MED ORDER — ELETRIPTAN HYDROBROMIDE 40 MG PO TABS: 40.0000 mg | ORAL_TABLET | ORAL | 11 refills | Status: AC | PRN

## 2021-04-22 NOTE — Progress Notes (Signed)
[facility]    Provider:  Dr Lucia Gaskins Requesting Provider: No ref. provider found Primary Care Provider:  [facility]  CC:  migraines  Chief Complaint  Patient presents with   Migraine    Rm 2  "migraines are controllable with medications, last one was a month ago just have to work on it as soon as it comes"      HPI:   Update 04/22/2021 JM: 56 year old female with history of periodic headaches followed by Dr. Lucia Gaskins.  Headaches have been well controlled on zonisamide and gabapentin. Reports migraines can be worse with certain triggers such as stress, lack of caffeine, over excitement or poor sleep. If able to avoid triggers, may experience 1 migraine per month but can last from 2-7 days.  Will use Relpax for acute management but at times will not take dose in time as it makes her feel groggy and this will lead to longer duration migraine. Referred for sleep study at prior visit but did have sleep study completed 08/2018 which was negative for sleep apnea.  No further concerns at this time.     Consult visit 08/19/2020 Dr. Lucia Gaskins: Tiffany Jones is a 57 y.o. female here as requested by No ref. provider found for periodic headaches. She is establishing care for her migraines, she saw a neurologist in [place], headaches are stable on zonisamide and gabapentin.She has been using these as a preventative for her migraines she has been on them since 2009. She cannot tolerate a higher dose of Gabapentin because of her weight and felt it was affecting her low blood pressure. When she decreased the gabapentin she felt better. She has low blood pressure and has orthostatic hypotension and syncope, she had extensive testing. Migraines started at the age of 24. Pulsating/pounding, sound sensitivity, sound sensitivity, nausea. Start on the left around the eye, they can radiate to the right side and back but mainly left side of the head, movement makes it worse, her  neurologist in [place] had her on Relpax, She has on average she has 2 migraines. Would be worse with period but she is menopausal.  Reviewed notes, labs and imaging from outside physicians, which showed:  Cbc,cmp normal 06/04/2020  From a thorough review of records, medications tried that can be used for migraine management: Propranolol contraindicated due to asthma, zonisamide, topiramate, relpax, sumatriptan, rizatriptan, zolmitriptan  Review of Systems: Patient complains of symptoms per HPI as well as the following symptoms headaches.  Pertinent negatives and positives per HPI. All others negative.   Social History   Socioeconomic History   Marital status: Widowed    Spouse name: Not on file   Number of children: 4   Years of education: 12   Highest education level: High school graduate  Occupational History   Not on file  Tobacco Use   Smoking status: Never   Smokeless tobacco: Never  Vaping Use   Vaping Use: Never used  Substance and Sexual Activity   Alcohol use: Yes    Comment: socially   Drug use: Never   Sexual activity: Not Currently    Birth control/protection: Post-menopausal  Other Topics Concern   Not on file  Social History Narrative   Lives with 2 son   Left handed   Caffeine: coffee 2 cups a day and about 2 other caffeine products a week.    Social Determinants of Health   Financial Resource Strain: Not on file  Food Insecurity: Not on file  Transportation  Needs: Not on file  Physical Activity: Not on file  Stress: Not on file  Social Connections: Not on file  Intimate Partner Violence: Not on file    Family History  Problem Relation Age of Onset   Breast cancer Mother 93   Hypertension Father    Alcohol abuse Father    Diabetes Sister    Hypertension Brother    Diabetes Brother    Lymphoma Maternal Grandfather    Asthma Son    Colon polyps Neg Hx    Colon cancer Neg Hx    Esophageal cancer Neg Hx    Rectal cancer Neg Hx    Stomach cancer  Neg Hx     Past Medical History:  Diagnosis Date   Anemia    Asthma    Enlarged heart    Gastroesophageal reflux disease without esophagitis 05/17/2018   GERD (gastroesophageal reflux disease)    History of abnormal mammogram    Migraine without status migrainosus, not intractable 05/17/2018   Migraines    Pre-diabetes    Sleep disturbance 05/17/2018    Patient Active Problem List   Diagnosis Date Noted   Migraine without status migrainosus, not intractable 05/17/2018   Gastroesophageal reflux disease without esophagitis 05/17/2018   Sleep disturbance 05/17/2018    Past Surgical History:  Procedure Laterality Date   BREAST LUMPECTOMY WITH RADIOACTIVE SEED LOCALIZATION Right 05/27/2020   Procedure: RIGHT BREAST LUMPECTOMY WITH RADIOACTIVE SEED LOCALIZATION;  Surgeon: Abigail Miyamoto, MD;  Location: [facility];  Service: General;  Laterality: Right;  LMA; START TIME OF 11:00 AM FOR 60 MINUTES ROOM 2   DILATION AND CURETTAGE OF UTERUS     ECTOPIC PREGNANCY SURGERY     Right breast biopsy     WISDOM TOOTH EXTRACTION      Current Outpatient Medications  Medication Sig Dispense Refill   albuterol (PROVENTIL) (2.5 MG/3ML) 0.083% nebulizer solution Take 3 mLs (2.5 mg total) by nebulization every 6 (six) hours as needed for wheezing or shortness of breath. 150 mL 0   albuterol (VENTOLIN HFA) 108 (90 Base) MCG/ACT inhaler Inhale 2 puffs into the lungs every 6 (six) hours as needed for wheezing or shortness of breath. 18 g prn   famotidine (PEPCID) 20 MG tablet Take 1 tablet (20 mg total) by mouth 2 (two) times daily. With breakfast and dinner while symptoms persist 30 tablet 0   fluticasone (FLOVENT HFA) 110 MCG/ACT inhaler Inhale 2 puffs into the lungs 2 (two) times daily. 1 each 11   loperamide (IMODIUM) 2 MG capsule Take 1 capsule (2 mg total) by mouth 4 (four) times daily as needed for diarrhea or loose stools. 12 capsule 0   loratadine (CLARITIN) 10 MG tablet Take  10 mg by mouth daily.     omeprazole (PRILOSEC) 40 MG capsule TAKE 1 CAPSULE BY MOUTH ONCE DAILY TO DECREASE STOMACH ACID. 90 capsule 0   Simethicone 125 MG TABS Take 1 tablet (125 mg total) by mouth 2 (two) times daily as needed. As needed for gas pain. Avoid taking within 1 hour of taking other medications. 21 tablet 0   eletriptan (RELPAX) 40 MG tablet Take 1 tablet (40 mg total) by mouth as needed for migraine or headache. May repeat in 2 hours if headache persists or recurs. 9 tablet 11   fluticasone (FLONASE) 50 MCG/ACT nasal spray Place 1-2 sprays into both nostrils daily for 7 days. 1 g 0   gabapentin (NEURONTIN) 100 MG capsule Take 1 capsule (100  mg total) by mouth 2 (two) times daily. 180 capsule 3   ondansetron (ZOFRAN-ODT) 4 MG disintegrating tablet Take 1-2 tablets (4-8 mg total) by mouth every 8 (eight) hours as needed. For migraine and/or nausea. 30 tablet 11   zonisamide (ZONEGRAN) 25 MG capsule Take 1 capsule (25 mg total) by mouth daily. 90 capsule 3   No current facility-administered medications for this visit.    Allergies as of 04/22/2021 - Review Complete 04/22/2021  Allergen Reaction Noted   Sulfa antibiotics Hives 03/22/2017    Vitals: BP 127/70    Pulse 66    Ht 5\' 4"  (1.626 m)    Wt 271 lb (122.9 kg)    LMP 07/18/2017    BMI 46.52 kg/m  Last Weight:  Wt Readings from Last 1 Encounters:  04/22/21 271 lb (122.9 kg)   Last Height:   Ht Readings from Last 1 Encounters:  04/22/21 5\' 4"  (1.626 m)     Physical exam: Exam: Gen: NAD, very pleasant middle-aged African-American female, conversant, well nourised, obese, well groomed                     CV: RRR, no MRG. No Carotid Bruits. No peripheral edema, warm, nontender Eyes: Conjunctivae clear without exudates or hemorrhage  Neuro: Detailed Neurologic Exam  Speech:    Speech is normal; fluent and spontaneous with normal comprehension.  Cognition:    The patient is oriented to person, place, and time;      recent and remote memory intact;     language fluent;     normal attention, concentration,     fund of knowledge Cranial Nerves:    The pupils are equal, round, and reactive to light. Visual fields are full to finger confrontation. Extraocular movements are intact. Trigeminal sensation is intact and the muscles of mastication are normal. The face is symmetric. The palate elevates in the midline. Hearing intact. Voice is normal. Shoulder shrug is normal. The tongue has normal motion without fasciculations.   Coordination:    Normal   Gait:    normal.   Motor Observation:    No asymmetry, no atrophy, and no involuntary movements noted. Tone:    Normal muscle tone.    Posture:    Posture is normal. normal erect    Strength:    Strength is V/V in the upper and lower limbs.      Sensation: intact to LT     Reflex Exam:  DTR's:    Deep tendon reflexes in the upper and lower extremities are normal bilaterally.   Toes:    The toes are downgoing bilaterally.   Clonus:    Clonus is absent.     Assessment/Plan:  very pleasant 56 year old female with chronic migraines previously managed by neurologist in [place], 58 and transferred care to Dr. Vahakulmu in 08/2020.  Doing well on current preventative meds (gabapentin/zonisamide).  Delayed use of Relpax due to grogginess feeling after taking causing prolonged migraine duration.  Completed sleep study 08/2018 which was negative for sleep apnea.     Continue Zonegran and Neurontin preventatively as working very well - refills provided Trial Nurtec 75 mg daily as needed for acute migraine. Max dose 75mg /24 hours - has trialed other emergent medications (triptans) without benefit or intolerance (see HPI) - samples provided - will call if beneficial for official order If no benefit, can consider trialing Ubrelvy or continuation of Relpax Continue Zofran as needed for nausea associated with migraine Discussed  importance of avoiding migraine  triggers   Plans on moving to [place] area within the next 1-2 weeks. She plans on establishing care with neurologist in the area but advised if she is unable to establish care there, we will be more than happy to continue to see her. Will plan on f/u in 1 year (or sooner if needed) unless she establishes care with neurologist in [place] area    No orders of the defined types were placed in this encounter.   Meds ordered this encounter  Medications   eletriptan (RELPAX) 40 MG tablet    Sig: Take 1 tablet (40 mg total) by mouth as needed for migraine or headache. May repeat in 2 hours if headache persists or recurs.    Dispense:  9 tablet    Refill:  11   gabapentin (NEURONTIN) 100 MG capsule    Sig: Take 1 capsule (100 mg total) by mouth 2 (two) times daily.    Dispense:  180 capsule    Refill:  3   ondansetron (ZOFRAN-ODT) 4 MG disintegrating tablet    Sig: Take 1-2 tablets (4-8 mg total) by mouth every 8 (eight) hours as needed. For migraine and/or nausea.    Dispense:  30 tablet    Refill:  11   zonisamide (ZONEGRAN) 25 MG capsule    Sig: Take 1 capsule (25 mg total) by mouth daily.    Dispense:  90 capsule    Refill:  3        CC:  Pa, [facility]   I spent 24 minutes of face-to-face and non-face-to-face time with patient.  This included previsit chart review, lab review, study review, order entry, electronic health record documentation, patient education and discussion regarding chronic migraine headaches, ongoing use of current medications, migraine triggers and preventative measures and answered all other questions to patient satisfaction  Ihor AustinJessica McCue, AGNP-BC  [facility] [address] [place], [place] [address]  Phone [phone] Fax [phone] Note: This document was prepared with digital dictation and possible smart phrase technology. Any transcriptional errors that result from this process are  unintentional.

## 2021-05-08 ENCOUNTER — Other Ambulatory Visit: Payer: Self-pay | Admitting: Physician Assistant

## 2021-05-08 DIAGNOSIS — K219 Gastro-esophageal reflux disease without esophagitis: Secondary | ICD-10-CM

## 2021-05-10 ENCOUNTER — Other Ambulatory Visit: Payer: Self-pay | Admitting: Physician Assistant

## 2021-05-10 DIAGNOSIS — K219 Gastro-esophageal reflux disease without esophagitis: Secondary | ICD-10-CM

## 2021-05-10 NOTE — Telephone Encounter (Signed)
Requested medication (s) are due for refill today: Yes ? ?Requested medication (s) are on the active medication list: Yes ? ?Last refill:  02/01/21 ? ?Future visit scheduled: No ? ?Notes to clinic:  Is pt. Still seen at the practice? ? ? ? ?Requested Prescriptions  ?Pending Prescriptions Disp Refills  ? omeprazole (PRILOSEC) 40 MG capsule [Pharmacy Med Name: Omeprazole 40 MG Oral Capsule Delayed Release] 90 capsule 0  ?  Sig: TAKE 1 CAPSULE BY MOUTH ONCE DAILY TO DECREASE STOMACH ACID.  ?  ? Gastroenterology: Proton Pump Inhibitors Passed - 05/08/2021 11:21 AM  ?  ?  Passed - Valid encounter within last 12 months  ?  Recent Outpatient Visits   ? ?      ? 11 months ago Hypertension, unspecified type  ? [facility] [place], [place], [place]  ? 1 year ago Persistent cough for 3 weeks or longer  ? [facility] [place], [place], [place]  ? 1 year ago Mild intermittent asthma without complication  ? [facility] [place], [place], NP  ? 2 years ago Moderate asthma with exacerbation, unspecified whether persistent  ? [facility] [place], [place], [place]  ? 2 years ago Hypertension, unspecified type  ? [facility] [place], [place], [place]  ? ?  ?  ? ?  ?  ?  ? ?

## 2021-07-21 ENCOUNTER — Encounter (HOSPITAL_COMMUNITY): Payer: Self-pay

## 2022-02-27 IMAGING — MG MM PLC BREAST LOC DEV 1ST LESION INC*R*
7 series · 7 of 7 positions shown · non-contrast
Comparison: Previous exam(s).

CLINICAL DATA: 54-year-old with a biopsy-proven high risk
sclerosing papilloma involving the UPPER INNER QUADRANT of RIGHT
breast. Radioactive seed localization is performed in anticipation
of excisional biopsy.

EXAM:
MAMMOGRAPHIC GUIDED RADIOACTIVE SEED LOCALIZATION OF THE RIGHT
BREAST

[R ML (1 of 4)]
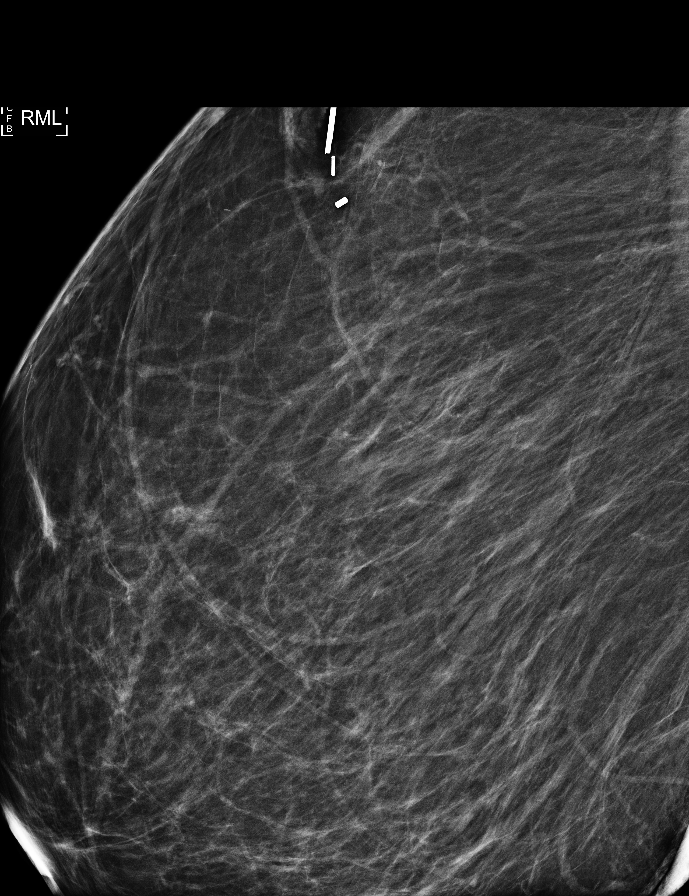

[R ML (2 of 4)]
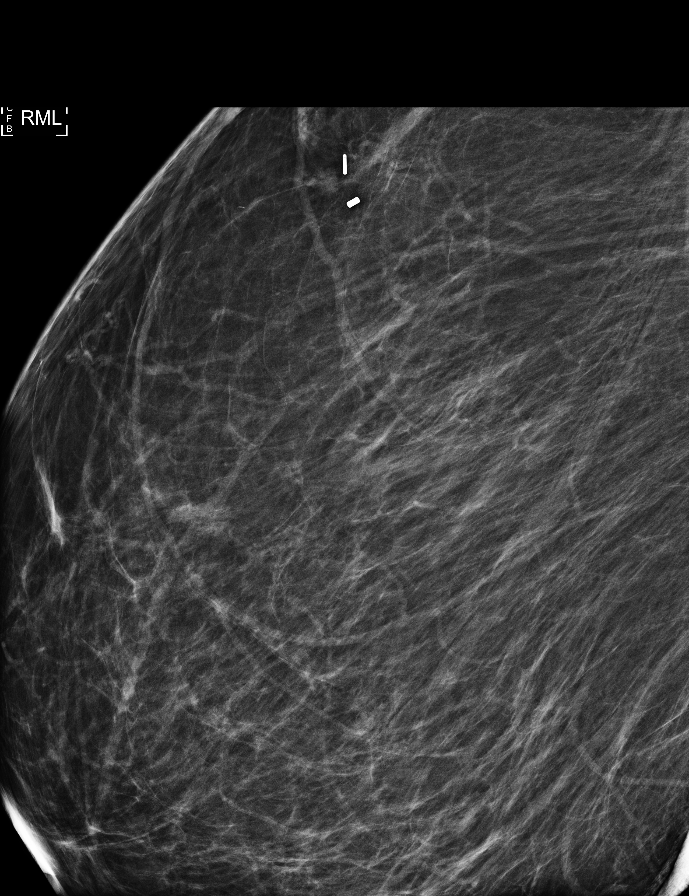

[R ML (3 of 4)]
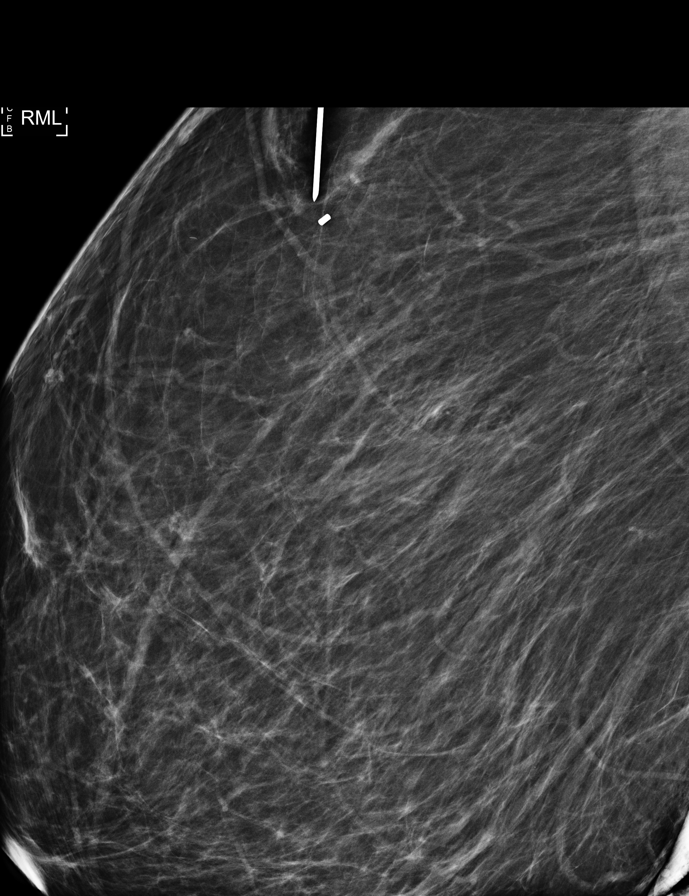

[R ML (4 of 4)]
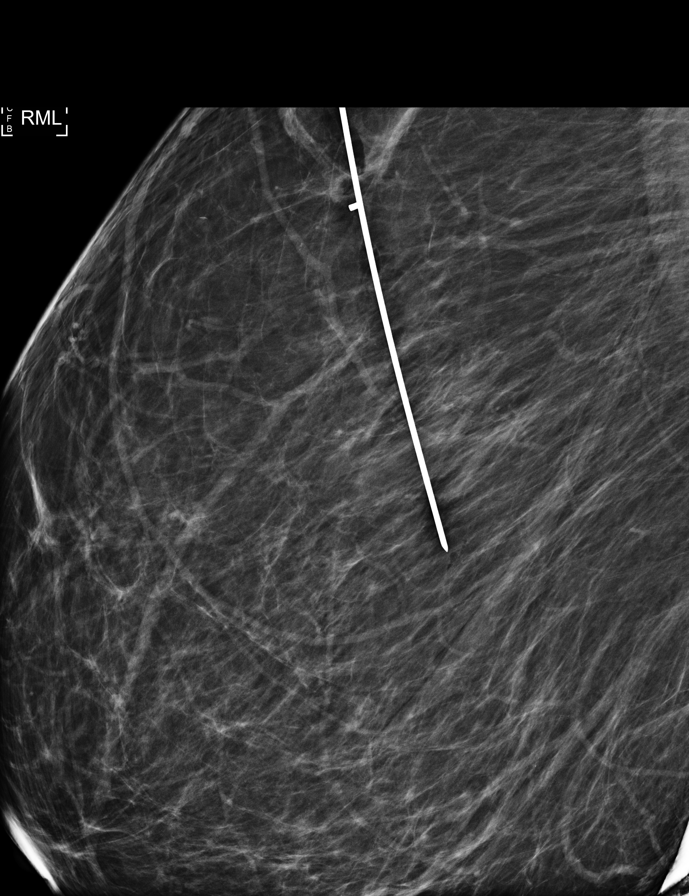

[R CC (1 of 3)]
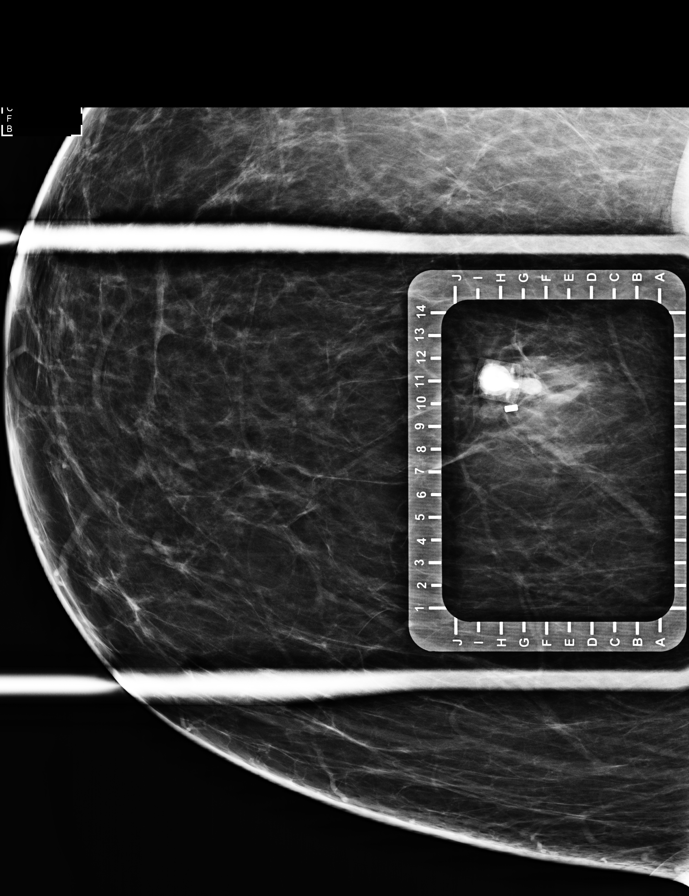

[R CC (2 of 3)]
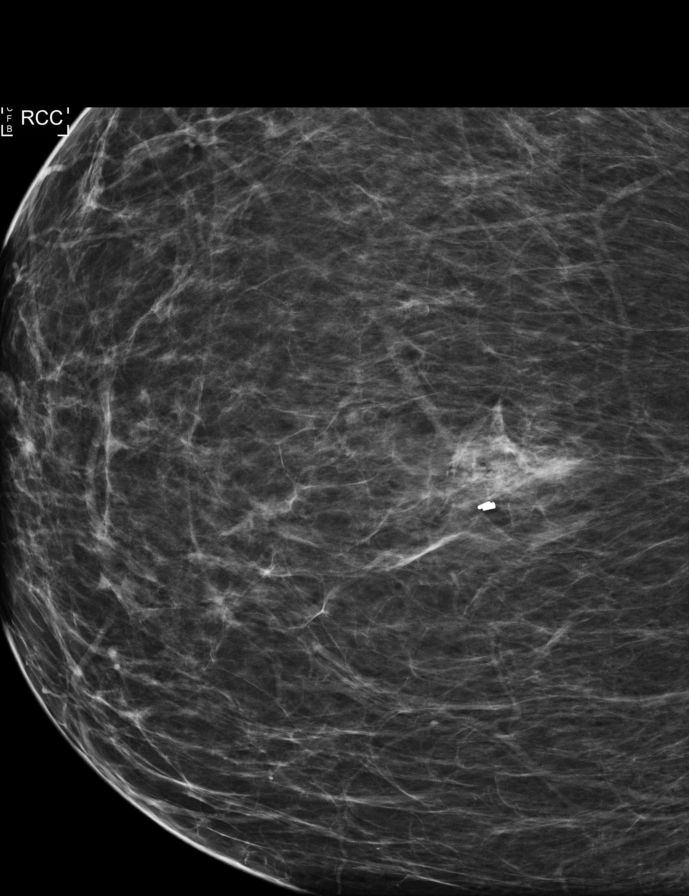

[R CC (3 of 3)]
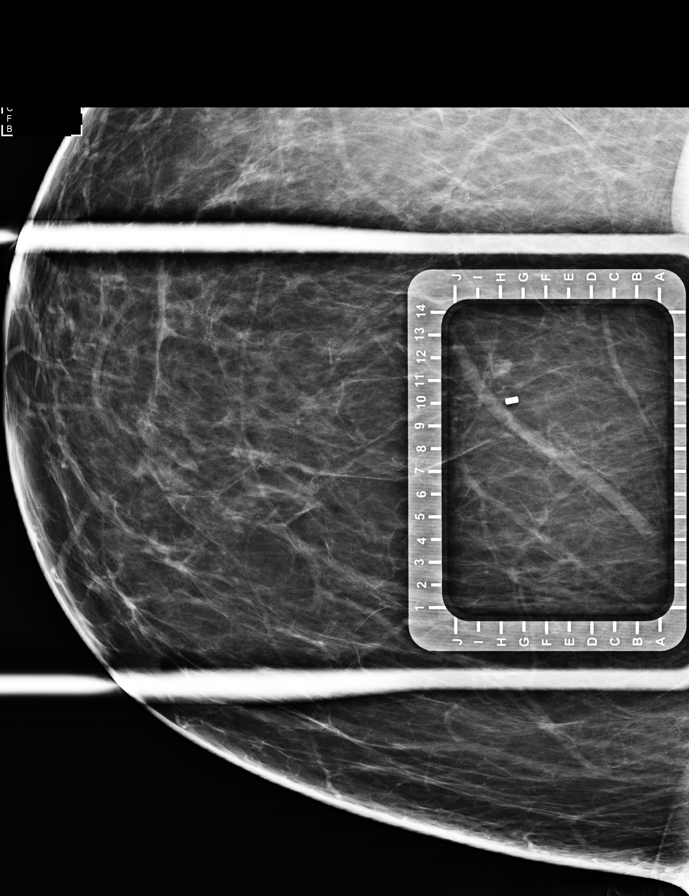

[7 of 7 positions shown; findings below may reference images not displayed]

FINDINGS: Patient presents for radioactive seed localization prior to RIGHT
breast excisional biopsy. I met with the patient and we discussed
the procedure of seed localization including benefits and
alternatives. We discussed the high likelihood of a successful
procedure. We discussed the risks of the procedure including
infection, bleeding, tissue injury and further surgery. We discussed
the low dose of radioactivity involved in the procedure. Informed,
written consent was given.

The usual time-out protocol was performed immediately prior to the
procedure.

Using mammographic guidance, sterile technique with chlorhexidine as
skin antisepsis, 1% lidocaine as local anesthetic, an X-X10
radioactive seed was used to localize the mass and the associated
cylinder shaped tissue marking clip in the UPPER INNER RIGHT breast
using a superior approach. The follow-up mammogram images confirm
that the seed is appropriately positioned immediately adjacent to
the nodule and the clip. The images are marked for Dr. Amazigh.

Follow-up survey of the patient confirms the presence of the
radioactive seed.

Order number of X-X10 seed: [id number]

Total activity: 0.260 mCi

Reference Date: 01/15/2020

The patient tolerated the procedure well and was released from the
[REDACTED]. She was given instructions regarding seed removal.
IMPRESSION: Radioactive seed localization of a biopsy-proven papilloma involving
the UPPER INNER QUADRANT of the RIGHT breast. No apparent
complications.

## 2022-02-28 IMAGING — DX MM BREAST SURGICAL SPECIMEN
2 series · 4 of 4 positions shown · non-contrast
Comparison: Previous exam(s).

CLINICAL DATA: Evaluate surgical specimen following excision of
RIGHT breast papilloma.

EXAM:
SPECIMEN RADIOGRAPH OF THE RIGHT BREAST

[Series 2: specimen digital x-ray, derived · right · 0.07mm/px · 2 of 2 slices shown (1 of 2)]
[im 1/2]
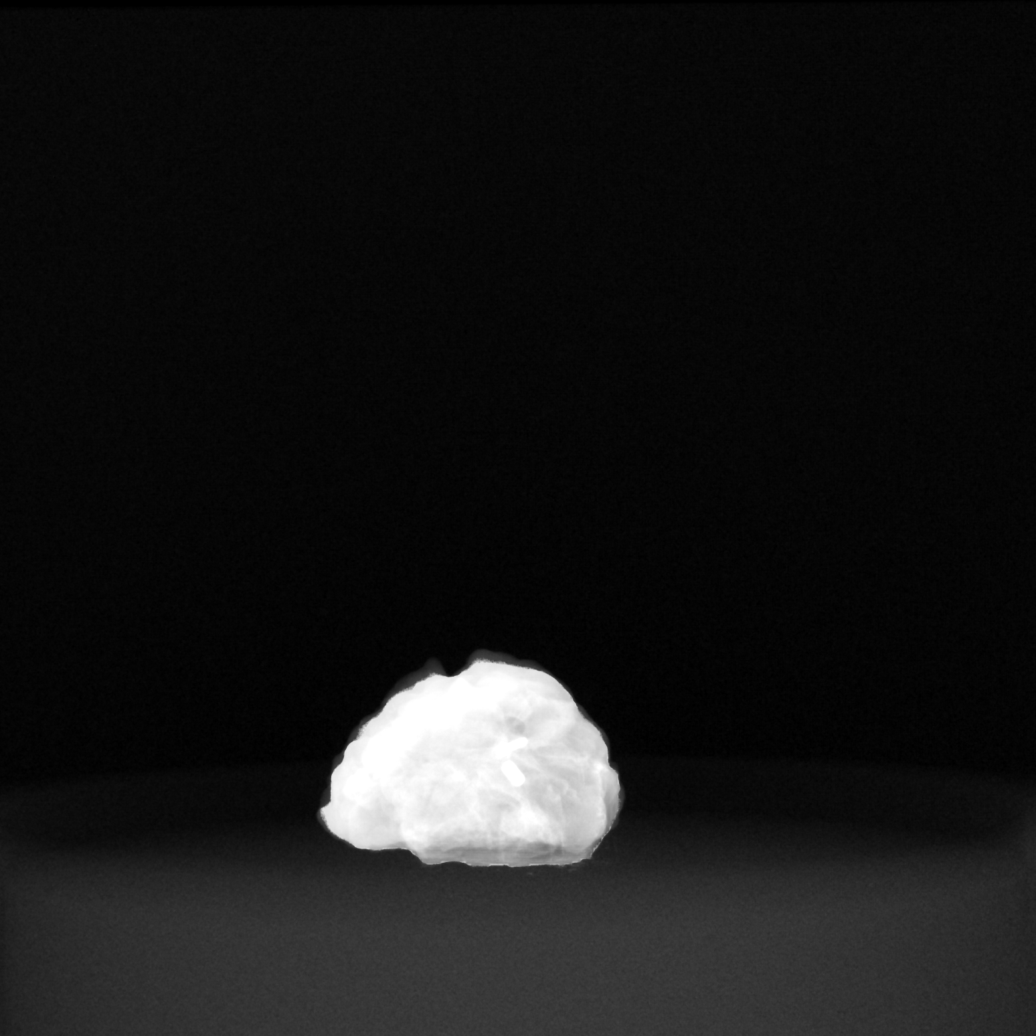
[im 2/2]
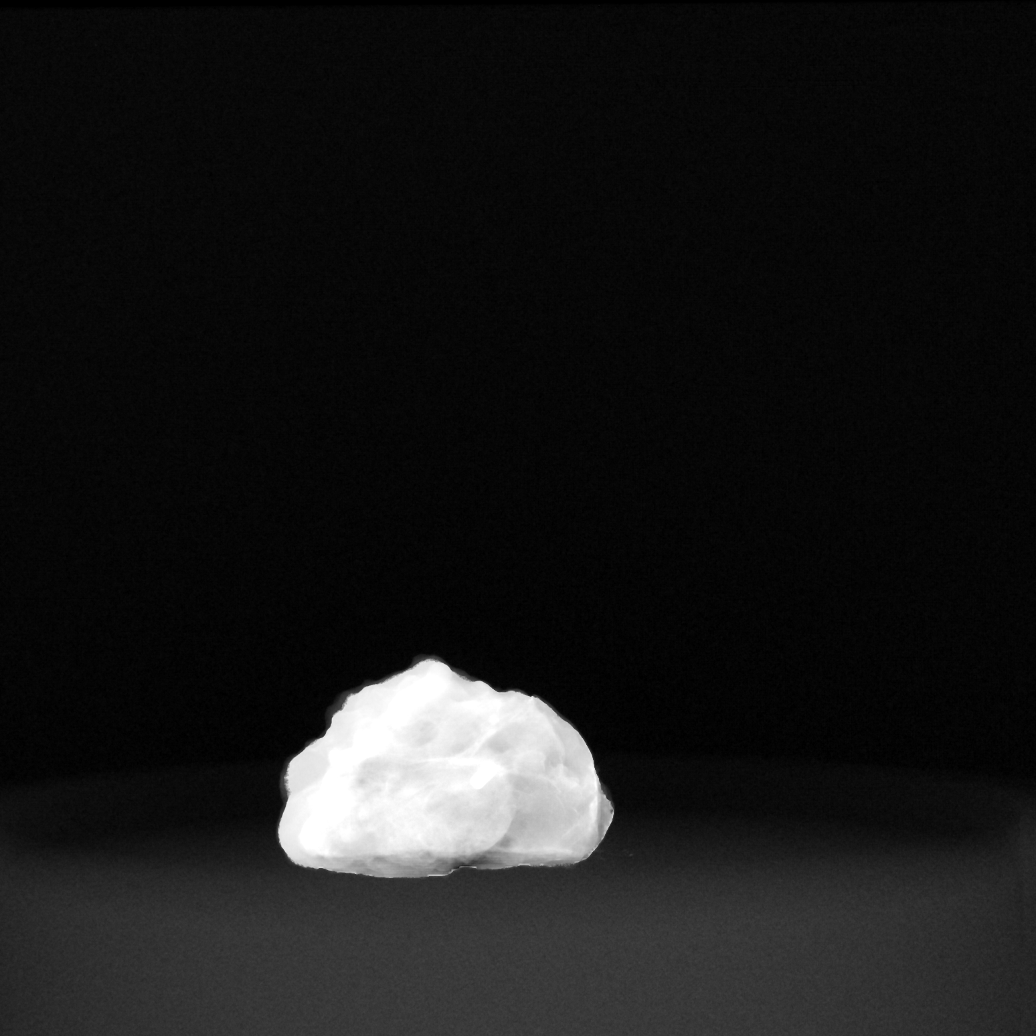

[Series 4: specimen digital x-ray, derived · right · 0.07mm/px · 2 of 2 slices shown (2 of 2)]
[im 1/2]
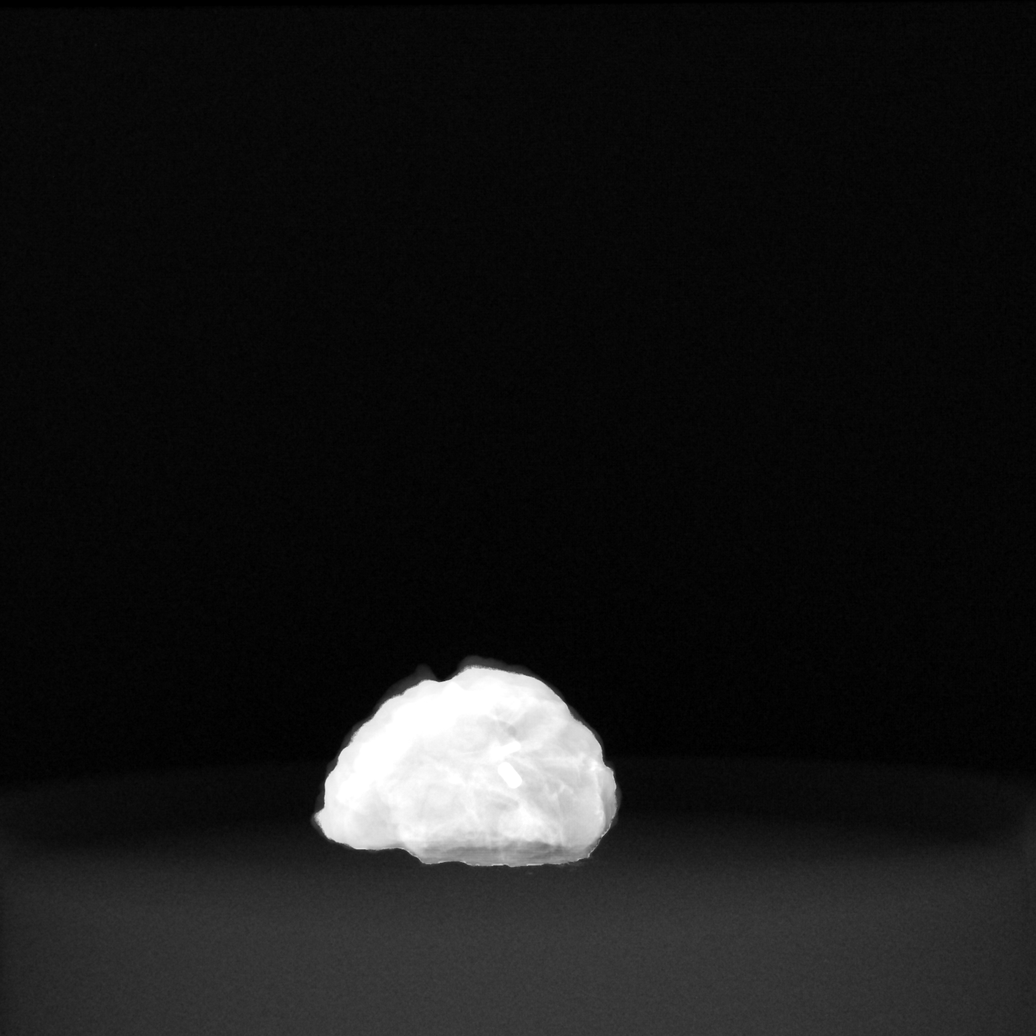
[im 2/2]
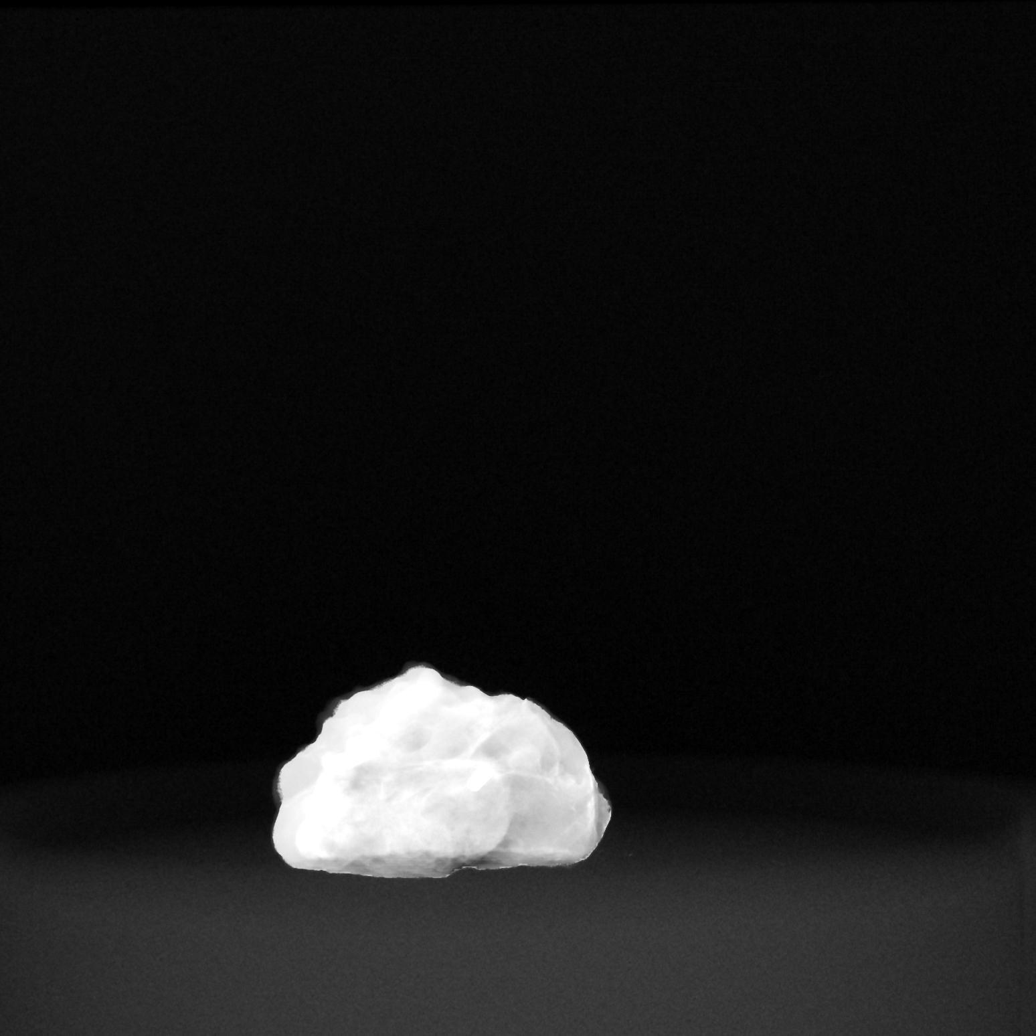

[4 of 4 positions shown; findings below may reference images not displayed]

FINDINGS: Status post excision of the RIGHT breast. The radioactive seed and
CYLINDER biopsy marker clip are present and completely intact.
IMPRESSION: Specimen radiograph of the RIGHT breast.

## 2022-04-26 ENCOUNTER — Ambulatory Visit: Payer: 59 | Admitting: Adult Health

## 2022-11-28 ENCOUNTER — Other Ambulatory Visit: Payer: Self-pay
